# Patient Record
Sex: Male | Born: 1970 | Race: White | Hispanic: No | Smoking: Current every day smoker
Health system: Southern US, Community
[De-identification: ages and names within clinical notes are randomized; demographics above are authoritative.]

## PROBLEM LIST (undated history)

## (undated) DIAGNOSIS — C679 Malignant neoplasm of bladder, unspecified: Secondary | ICD-10-CM

## (undated) DIAGNOSIS — I10 Essential (primary) hypertension: Secondary | ICD-10-CM

## (undated) DIAGNOSIS — G8929 Other chronic pain: Secondary | ICD-10-CM

## (undated) DIAGNOSIS — M549 Dorsalgia, unspecified: Secondary | ICD-10-CM

## (undated) HISTORY — PX: BLADDER SURGERY: SHX569

## (undated) HISTORY — PX: OTHER SURGICAL HISTORY: SHX169

---

## 2003-07-07 ENCOUNTER — Encounter: Payer: Self-pay | Admitting: Internal Medicine

## 2003-07-07 ENCOUNTER — Encounter: Admission: RE | Admit: 2003-07-07 | Discharge: 2003-07-07 | Payer: Self-pay | Admitting: Internal Medicine

## 2003-11-02 ENCOUNTER — Ambulatory Visit (HOSPITAL_BASED_OUTPATIENT_CLINIC_OR_DEPARTMENT_OTHER): Admission: RE | Admit: 2003-11-02 | Discharge: 2003-11-02 | Payer: Self-pay | Admitting: Orthopedic Surgery

## 2004-11-14 ENCOUNTER — Encounter: Admission: RE | Admit: 2004-11-14 | Discharge: 2004-11-14 | Payer: Self-pay | Admitting: Family Medicine

## 2008-03-18 ENCOUNTER — Encounter: Payer: Self-pay | Admitting: Urology

## 2008-03-18 ENCOUNTER — Ambulatory Visit (HOSPITAL_BASED_OUTPATIENT_CLINIC_OR_DEPARTMENT_OTHER): Admission: RE | Admit: 2008-03-18 | Discharge: 2008-03-18 | Payer: Self-pay | Admitting: Urology

## 2008-06-18 ENCOUNTER — Emergency Department (HOSPITAL_COMMUNITY): Admission: EM | Admit: 2008-06-18 | Discharge: 2008-06-18 | Payer: Self-pay | Admitting: Emergency Medicine

## 2008-09-30 ENCOUNTER — Emergency Department (HOSPITAL_COMMUNITY): Admission: EM | Admit: 2008-09-30 | Discharge: 2008-09-30 | Payer: Self-pay | Admitting: Emergency Medicine

## 2009-09-07 ENCOUNTER — Encounter
Admission: RE | Admit: 2009-09-07 | Discharge: 2009-10-12 | Payer: Self-pay | Admitting: Physical Medicine and Rehabilitation

## 2009-11-14 ENCOUNTER — Ambulatory Visit (HOSPITAL_COMMUNITY)
Admission: RE | Admit: 2009-11-14 | Discharge: 2009-11-14 | Payer: Self-pay | Admitting: Physical Medicine and Rehabilitation

## 2010-08-22 ENCOUNTER — Emergency Department (HOSPITAL_BASED_OUTPATIENT_CLINIC_OR_DEPARTMENT_OTHER): Admission: EM | Admit: 2010-08-22 | Discharge: 2010-08-22 | Payer: Self-pay | Admitting: Emergency Medicine

## 2010-08-22 ENCOUNTER — Inpatient Hospital Stay (HOSPITAL_COMMUNITY): Admission: AD | Admit: 2010-08-22 | Discharge: 2010-08-24 | Payer: Self-pay | Admitting: Psychiatry

## 2010-08-22 ENCOUNTER — Ambulatory Visit: Payer: Self-pay | Admitting: Psychiatry

## 2010-08-22 ENCOUNTER — Ambulatory Visit: Payer: Self-pay | Admitting: Diagnostic Radiology

## 2010-12-29 ENCOUNTER — Emergency Department (HOSPITAL_COMMUNITY)
Admission: EM | Admit: 2010-12-29 | Discharge: 2010-12-29 | Payer: Self-pay | Source: Home / Self Care | Admitting: Emergency Medicine

## 2010-12-31 LAB — URINALYSIS, ROUTINE W REFLEX MICROSCOPIC
Bilirubin Urine: NEGATIVE
Hgb urine dipstick: NEGATIVE
Ketones, ur: NEGATIVE mg/dL
Leukocytes, UA: NEGATIVE
Nitrite: NEGATIVE
Protein, ur: NEGATIVE mg/dL
Specific Gravity, Urine: 1.013 (ref 1.005–1.030)
Urine Glucose, Fasting: >1000 mg/dL — AB
Urobilinogen, UA: 0.2 mg/dL (ref 0.0–1.0)
pH: 6.5 (ref 5.0–8.0)

## 2010-12-31 LAB — GLUCOSE, CAPILLARY
Glucose-Capillary: 331 mg/dL — ABNORMAL HIGH (ref 70–99)
Glucose-Capillary: 207 mg/dL — ABNORMAL HIGH (ref 70–99)

## 2010-12-31 LAB — BASIC METABOLIC PANEL
BUN: 1 mg/dL — ABNORMAL LOW (ref 6–23)
CO2: 26 mEq/L (ref 19–32)
Calcium: 9.3 mg/dL (ref 8.4–10.5)
Chloride: 103 mEq/L (ref 96–112)
Creatinine, Ser: 0.61 mg/dL (ref 0.4–1.5)
GFR calc Af Amer: >60 mL/min (ref >60)
GFR calc non Af Amer: >60 mL/min (ref >60)
Glucose, Bld: 268 mg/dL — ABNORMAL HIGH (ref 70–99)
Potassium: 3.2 mEq/L — ABNORMAL LOW (ref 3.5–5.1)
Sodium: 137 mEq/L (ref 135–145)

## 2010-12-31 LAB — URINE MICROSCOPIC-ADD ON: Urine-Other: NONE SEEN

## 2011-01-06 ENCOUNTER — Encounter: Payer: Self-pay | Admitting: Pain Medicine

## 2011-01-31 ENCOUNTER — Inpatient Hospital Stay (INDEPENDENT_AMBULATORY_CARE_PROVIDER_SITE_OTHER)
Admission: RE | Admit: 2011-01-31 | Discharge: 2011-01-31 | Disposition: A | Discharge status: Home / Self Care | Payer: Self-pay | Source: Ambulatory Visit | Attending: Family Medicine | Admitting: Family Medicine

## 2011-01-31 DIAGNOSIS — K029 Dental caries, unspecified: Secondary | ICD-10-CM

## 2011-02-28 LAB — GLUCOSE, CAPILLARY
Glucose-Capillary: 276 mg/dL — ABNORMAL HIGH (ref 70–99)
Glucose-Capillary: 278 mg/dL — ABNORMAL HIGH (ref 70–99)
Glucose-Capillary: 279 mg/dL — ABNORMAL HIGH (ref 70–99)
Glucose-Capillary: 300 mg/dL — ABNORMAL HIGH (ref 70–99)
Glucose-Capillary: 319 mg/dL — ABNORMAL HIGH (ref 70–99)
Glucose-Capillary: 322 mg/dL — ABNORMAL HIGH (ref 70–99)
Glucose-Capillary: 341 mg/dL — ABNORMAL HIGH (ref 70–99)
Glucose-Capillary: 344 mg/dL — ABNORMAL HIGH (ref 70–99)
Glucose-Capillary: 350 mg/dL — ABNORMAL HIGH (ref 70–99)

## 2011-02-28 LAB — URINALYSIS, ROUTINE W REFLEX MICROSCOPIC
Bilirubin Urine: NEGATIVE
Glucose, UA: >1000 mg/dL — AB
Hgb urine dipstick: NEGATIVE
Ketones, ur: NEGATIVE mg/dL
Leukocytes, UA: NEGATIVE
Nitrite: NEGATIVE
Protein, ur: NEGATIVE mg/dL
Specific Gravity, Urine: 1.044 — ABNORMAL HIGH (ref 1.005–1.030)
Urobilinogen, UA: 1 mg/dL (ref 0.0–1.0)
pH: 6.5 (ref 5.0–8.0)

## 2011-02-28 LAB — ELECTROLYTE PANEL
CO2: 28 mEq/L (ref 19–32)
Potassium: 3.8 mEq/L (ref 3.5–5.1)
Sodium: 140 mEq/L (ref 135–145)

## 2011-02-28 LAB — LIPID PANEL
Cholesterol: 231 mg/dL — ABNORMAL HIGH (ref 0–200)
HDL: 45 mg/dL (ref >39)
LDL Cholesterol: 123 mg/dL — ABNORMAL HIGH (ref 0–99)
Total CHOL/HDL Ratio: 5.1 RATIO
Triglycerides: 314 mg/dL — ABNORMAL HIGH (ref <150)
VLDL: 63 mg/dL — ABNORMAL HIGH (ref 0–40)

## 2011-02-28 LAB — CBC
HCT: 47.3 % (ref 39.0–52.0)
Hemoglobin: 15.5 g/dL (ref 13.0–17.0)
MCH: 28.6 pg (ref 26.0–34.0)
MCHC: 32.8 g/dL (ref 30.0–36.0)
MCV: 87.2 fL (ref 78.0–100.0)
Platelets: 316 10*3/uL (ref 150–400)
RBC: 5.43 MIL/uL (ref 4.22–5.81)
RDW: 13.4 % (ref 11.5–15.5)
WBC: 12.6 10*3/uL — ABNORMAL HIGH (ref 4.0–10.5)

## 2011-02-28 LAB — HEMOGLOBIN A1C
Hgb A1c MFr Bld: 12.7 % — ABNORMAL HIGH (ref <5.7)
Mean Plasma Glucose: 318 mg/dL — ABNORMAL HIGH (ref <117)

## 2011-02-28 LAB — POCT TOXICOLOGY PANEL

## 2011-02-28 LAB — DIFFERENTIAL
Basophils Absolute: 0 10*3/uL (ref 0.0–0.1)
Lymphocytes Relative: 25 % (ref 12–46)
Lymphs Abs: 3.1 10*3/uL (ref 0.7–4.0)
Neutro Abs: 8.8 10*3/uL — ABNORMAL HIGH (ref 1.7–7.7)
Neutrophils Relative %: 70 % (ref 43–77)

## 2011-02-28 LAB — ETHANOL: Alcohol, Ethyl (B): <10 mg/dL (ref 0–10)

## 2011-02-28 LAB — BUN: BUN: 10 mg/dL (ref 6–23)

## 2011-02-28 LAB — URINE MICROSCOPIC-ADD ON

## 2011-02-28 LAB — TSH: TSH: 1.11 u[IU]/mL (ref 0.350–4.500)

## 2011-02-28 LAB — CREATININE, SERUM: GFR calc non Af Amer: >60 mL/min (ref >60)

## 2011-04-30 NOTE — Op Note (Signed)
NAME:  Lawrence Pearson, Lawrence Pearson              ACCOUNT NO.:  [id number]   MEDICAL RECORD NO.:  [id number]          PATIENT TYPE:  AMB   LOCATION:  [facility]                         FACILITY:  [facility]   PHYSICIAN:  Bertram Millard. Dahlstedt, M.D.DATE OF BIRTH:  1971/08/03   DATE OF PROCEDURE:  03/18/2008  DATE OF DISCHARGE:                               OPERATIVE REPORT   PREOPERATIVE DIAGNOSIS:  Bladder tumor.   POSTOPERATIVE DIAGNOSIS:  Bladder tumor.   PRINCIPAL PROCEDURE:  TUR bladder tumor, 3 cm in size.   SURGEON:  Bertram Millard. Dahlstedt, M.D.   ANESTHESIA:  General with LMA.   COMPLICATIONS:  None.   BRIEF HISTORY:  A 40 year old male with a recently diagnosed left-sided  bladder tumor.  He presented with gross hematuria.  Upper tract  evaluation was negative.  CT showed a moderate sized left sidewall  tumor.  Cystoscopy confirmed this.  He presents at this time for  definitive TURBT.  Risks and complications have been discussed with the  patient and his wife.  They understand and desire to proceed.   DESCRIPTION OF PROCEDURE:  The patient was identified in the holding  area.  He was taken to the operating room where general anesthetic was  administered using the LMA.  He was placed in the dorsal lithotomy  position.  Genitalia and perineum were prepped and draped.  A time-out  was then called.  Proper patient identification, surgical site,  antibiotics, surgeon and position were discussed.  The procedure then  commenced.  His urethra was dilated to 80 [place] with [employer] sounds.  The obturator was then placed and the resectoscope placed through this.  There was an obvious tumor in the left sidewall, just lateral to the  ureteral orifice.  It was quite large.  The bladder was inspected  circumferentially otherwise.  There was no evident bladder lesion or  urothelial abnormality.  Using the loupe the tumor was resected.  First  the bulk of the tumor was resected and sent as bladder tumor.  I  then  sent the stump of the tumor which had been resected down into the muscle  and labeled bladder tumor base.  Hemostasis was then achieved with a  coag current.  There was no tumor specimen left in the bladder.  The  bladder was then drained with a 20 French Foley catheter which was  hooked to dependent drainage.   The patient tolerated the procedure well.  He was awakened, taken to the  PACU in stable condition.  He will be discharged on 3 days of Bactrim DS  one p.o. b.i.d. and Urell one p.o. q.6 h.  He will follow up in  approximately 2 weeks.     Bertram Millard. Dahlstedt, M.D.  Electronically Signed    SMD/MEDQ  D:  03/18/2008  T:  03/18/2008  Job:  [id number]

## 2011-05-03 NOTE — Op Note (Signed)
NAME:  Lawrence Pearson, Lawrence Pearson                        ACCOUNT NO.:  [id number]   MEDICAL RECORD NO.:  [id number]                   PATIENT TYPE:  AMB   LOCATION:  [facility]                                  FACILITY:  [facility]   PHYSICIAN:  Artist Pais. Mina Marble, M.D.           DATE OF BIRTH:  1971-04-04   DATE OF PROCEDURE:  11/02/2003  DATE OF DISCHARGE:                                 OPERATIVE REPORT   PREOPERATIVE DIAGNOSIS:  Traumatic laceration, right thumb, dorsal, with  nail plate fracture and loss of sterile matrix.   POSTOPERATIVE DIAGNOSIS:  Traumatic laceration, right thumb, dorsal, with  nail plate fracture and loss of sterile matrix.   PROCEDURE:  Partial distal phalangeal ostectomy, right thumb, with nail bed  repair.   SURGEON:  Artist Pais. Mina Marble, M.D.   ASSISTANT:  Aura Fey. Bobbe Medico.   ANESTHESIA:  General.   TOURNIQUET TIME:  Twenty minutes.   COMPLICATIONS:  No complications.   DRAINS:  No drains.   OPERATIVE REPORT:  The patient was taken to the operating room and after the  induction of adequate general anesthesia, the right upper extremity was  prepped and draped in the usual sterile fashion.  An Esmarch was used to  exsanguinate the limb.  Tourniquet was then inflated to 250 mmHg.  At this  point in time, inspection of the right thumb revealed a near-transverse  laceration dorsally with loss of the nail plate and a significant portion of  the middle of the sterile matrix down to the distal phalanx.  The wound was  debrided down to healthy tissue.  The distal aspect of the sterile matrix  was carefully dissected from the underlying distal phalanx and the distal  phalanx was removed in the distal 20% using a rongeur.  Once this was done,  the proximal aspect of the nail bed was also carefully elevated off the  underlying bone and the nail bed was then repaired with 5-0 undyed Vicryl x4  sutures.  After this was done, there was no exposed distal phalangeal bone.  The  wound was irrigated.  It was then dressed with Xeroform, 4 x 4's and  Coban wrap.  Patient tolerated the procedure well and went to recovery room  in stable fashion.                                               Artist Pais Mina Marble, M.D.    MAW/MEDQ  D:  11/02/2003  T:  11/02/2003  Job:  [id number]

## 2011-09-10 LAB — POCT HEMOGLOBIN-HEMACUE: Operator id: 114531

## 2011-09-17 LAB — DIFFERENTIAL
Lymphs Abs: 2.2
Monocytes Relative: 8
Neutro Abs: 3.9
Neutrophils Relative %: 57

## 2011-09-17 LAB — URINE MICROSCOPIC-ADD ON

## 2011-09-17 LAB — CBC
Hemoglobin: 13.7
MCHC: 32.4
RDW: 13.7

## 2011-09-17 LAB — URINALYSIS, ROUTINE W REFLEX MICROSCOPIC
Glucose, UA: >1000 — AB
Leukocytes, UA: NEGATIVE
pH: 5.5

## 2011-09-17 LAB — COMPREHENSIVE METABOLIC PANEL
BUN: 11
Calcium: 9.5
Glucose, Bld: 297 — ABNORMAL HIGH
Sodium: 134 — ABNORMAL LOW
Total Protein: 6.4

## 2012-01-01 ENCOUNTER — Inpatient Hospital Stay (HOSPITAL_COMMUNITY)
Admission: EM | Admit: 2012-01-01 | Discharge: 2012-01-03 | DRG: 287 | Disposition: A | Discharge status: Home / Self Care | Payer: Self-pay | Source: Ambulatory Visit | Attending: Internal Medicine | Admitting: Internal Medicine

## 2012-01-01 ENCOUNTER — Other Ambulatory Visit: Payer: Self-pay

## 2012-01-01 ENCOUNTER — Encounter (HOSPITAL_COMMUNITY): Payer: Self-pay | Admitting: Emergency Medicine

## 2012-01-01 ENCOUNTER — Emergency Department (HOSPITAL_COMMUNITY): Payer: Self-pay

## 2012-01-01 DIAGNOSIS — Z8551 Personal history of malignant neoplasm of bladder: Secondary | ICD-10-CM

## 2012-01-01 DIAGNOSIS — E669 Obesity, unspecified: Secondary | ICD-10-CM | POA: Diagnosis present

## 2012-01-01 DIAGNOSIS — Z9119 Patient's noncompliance with other medical treatment and regimen: Secondary | ICD-10-CM

## 2012-01-01 DIAGNOSIS — Z79899 Other long term (current) drug therapy: Secondary | ICD-10-CM

## 2012-01-01 DIAGNOSIS — Z23 Encounter for immunization: Secondary | ICD-10-CM

## 2012-01-01 DIAGNOSIS — I2510 Atherosclerotic heart disease of native coronary artery without angina pectoris: Principal | ICD-10-CM | POA: Diagnosis present

## 2012-01-01 DIAGNOSIS — IMO0002 Reserved for concepts with insufficient information to code with codable children: Secondary | ICD-10-CM | POA: Diagnosis present

## 2012-01-01 DIAGNOSIS — IMO0001 Reserved for inherently not codable concepts without codable children: Secondary | ICD-10-CM | POA: Diagnosis present

## 2012-01-01 DIAGNOSIS — Z794 Long term (current) use of insulin: Secondary | ICD-10-CM

## 2012-01-01 DIAGNOSIS — Z91199 Patient's noncompliance with other medical treatment and regimen due to unspecified reason: Secondary | ICD-10-CM

## 2012-01-01 DIAGNOSIS — E1165 Type 2 diabetes mellitus with hyperglycemia: Secondary | ICD-10-CM | POA: Diagnosis present

## 2012-01-01 DIAGNOSIS — I498 Other specified cardiac arrhythmias: Secondary | ICD-10-CM | POA: Diagnosis present

## 2012-01-01 DIAGNOSIS — E785 Hyperlipidemia, unspecified: Secondary | ICD-10-CM | POA: Diagnosis present

## 2012-01-01 DIAGNOSIS — F17200 Nicotine dependence, unspecified, uncomplicated: Secondary | ICD-10-CM | POA: Diagnosis present

## 2012-01-01 DIAGNOSIS — R079 Chest pain, unspecified: Secondary | ICD-10-CM | POA: Diagnosis present

## 2012-01-01 DIAGNOSIS — R209 Unspecified disturbances of skin sensation: Secondary | ICD-10-CM | POA: Diagnosis present

## 2012-01-01 HISTORY — DX: Malignant neoplasm of bladder, unspecified: C67.9

## 2012-01-01 LAB — DIFFERENTIAL
Basophils Absolute: 0.1 10*3/uL (ref 0.0–0.1)
Basophils Relative: 1 % (ref 0–1)
Eosinophils Relative: 1 % (ref 0–5)
Lymphocytes Relative: 33 % (ref 12–46)
Neutro Abs: 5.7 10*3/uL (ref 1.7–7.7)

## 2012-01-01 LAB — COMPREHENSIVE METABOLIC PANEL
ALT: 26 U/L (ref 0–53)
AST: 23 U/L (ref 0–37)
Albumin: 3.2 g/dL — ABNORMAL LOW (ref 3.5–5.2)
CO2: 24 mEq/L (ref 19–32)
Calcium: 10.2 mg/dL (ref 8.4–10.5)
GFR calc non Af Amer: >90 mL/min (ref >90)
Sodium: 135 mEq/L (ref 135–145)

## 2012-01-01 LAB — CBC
MCV: 90.3 fL (ref 78.0–100.0)
Platelets: 278 10*3/uL (ref 150–400)
RDW: 13.5 % (ref 11.5–15.5)
WBC: 9.8 10*3/uL (ref 4.0–10.5)

## 2012-01-01 LAB — APTT: aPTT: 25 seconds (ref 24–37)

## 2012-01-01 MED ORDER — NITROGLYCERIN 2 % TD OINT
1.0000 [in_us] | TOPICAL_OINTMENT | Freq: Four times a day (QID) | TRANSDERMAL | Status: DC
  Administered 2012-01-01 – 2012-01-02 (×3): 1 [in_us] via TOPICAL
  Filled 2012-01-01: qty 1
  Filled 2012-01-01 (×3): qty 30

## 2012-01-01 MED ORDER — SODIUM CHLORIDE 0.9 % IV BOLUS (SEPSIS)
1000.0000 mL | Freq: Once | INTRAVENOUS | Status: AC
  Administered 2012-01-01: 1000 mL via INTRAVENOUS

## 2012-01-01 MED ORDER — HYDROCODONE-ACETAMINOPHEN 10-325 MG PO TABS
1.0000 | ORAL_TABLET | Freq: Once | ORAL | Status: AC
  Administered 2012-01-01: 1 via ORAL
  Filled 2012-01-01: qty 1

## 2012-01-01 MED ORDER — INSULIN ASPART 100 UNIT/ML ~~LOC~~ SOLN
10.0000 [IU] | Freq: Once | SUBCUTANEOUS | Status: AC
  Administered 2012-01-01: 10 [IU] via SUBCUTANEOUS
  Filled 2012-01-01: qty 1

## 2012-01-01 MED ORDER — SODIUM CHLORIDE 0.9 % IV SOLN
Freq: Once | INTRAVENOUS | Status: AC
  Administered 2012-01-01: 22:00:00 via INTRAVENOUS

## 2012-01-01 NOTE — ED Notes (Signed)
Patient back from X-Ray.

## 2012-01-01 NOTE — ED Provider Notes (Signed)
History     CSN: [id number]  Arrival date & time 01/01/12  2134   First MD Initiated Contact with Patient 01/01/12 2148      Chief Complaint  Patient presents with  . Chest Pain    (Consider location/radiation/quality/duration/timing/severity/associated sxs/prior treatment) Patient is a 41 y.o. male presenting with chest pain. The history is provided by the patient.  Chest Pain    patient presents with chest pain which began after he got out of the shower. Pain described as substernal heaviness radiating to his neck and left shoulder. He had associated shortness of breath, diaphoresis, nausea. No prior history of this. He called EMS and was given nitroglycerin which improved his symptoms. He is currently almost chest pain free. Patient does have a history of diabetes but does not have any primary care followup  Past Medical History  Diagnosis Date  . Diabetes mellitus     No past surgical history on file.  No family history on file.  History  Substance Use Topics  . Smoking status: Not on file  . Smokeless tobacco: Not on file  . Alcohol Use:       Review of Systems  Cardiovascular: Positive for chest pain.  All other systems reviewed and are negative.    Allergies  Penicillins  Home Medications   Current Outpatient Rx  Name Route Sig Dispense Refill  . HYDROCODONE-ACETAMINOPHEN 10-325 MG PO TABS Oral Take 1 tablet by mouth every 6 (six) hours as needed. For pain    . METFORMIN HCL 500 MG PO TABS Oral Take 500 mg by mouth 2 (two) times daily with a meal.      BP 129/82  Pulse 107  Temp(Src) 99 F (37.2 C) (Oral)  Resp 20  SpO2 95%  Physical Exam  Nursing note and vitals reviewed. Constitutional: He is oriented to person, place, and time. He appears well-developed and well-nourished.  Non-toxic appearance. No distress.  HENT:  Head: Normocephalic and atraumatic.  Eyes: Conjunctivae, EOM and lids are normal. Pupils are equal, round, and reactive to  light.  Neck: Normal range of motion. Neck supple. No tracheal deviation present. No mass present.  Cardiovascular: Regular rhythm and normal heart sounds.  Tachycardia present.  Exam reveals no gallop.   No murmur heard. Pulmonary/Chest: Effort normal and breath sounds normal. No stridor. No respiratory distress. He has no decreased breath sounds. He has no wheezes. He has no rhonchi. He has no rales.  Abdominal: Soft. Normal appearance and bowel sounds are normal. He exhibits no distension. There is no tenderness. There is no rebound and no CVA tenderness.  Musculoskeletal: Normal range of motion. He exhibits no edema and no tenderness.  Neurological: He is alert and oriented to person, place, and time. He has normal strength. No cranial nerve deficit or sensory deficit. GCS eye subscore is 4. GCS verbal subscore is 5. GCS motor subscore is 6.  Skin: Skin is warm and dry. No abrasion and no rash noted.  Psychiatric: His speech is normal and behavior is normal. His affect is blunt.    ED Course  Procedures (including critical care time)   Labs Reviewed  CBC  DIFFERENTIAL  POCT I-STAT TROPONIN I  I-STAT TROPONIN I  COMPREHENSIVE METABOLIC PANEL  PROTIME-INR  APTT   Dg Chest 2 View  01/01/2012  *RADIOLOGY REPORT*  Clinical Data: Left chest pain  CHEST - 2 VIEW  Comparison: None.  Findings: Decreased lung volumes accentuating the vascular markings.  Normal heart size without  CHF, pneumonia, collapse, consolidation, effusion or pneumothorax.  Trachea midline.  IMPRESSION: Low volume exam.  No acute finding.  Original Report Authenticated By: Judie Petit. Ruel Favors, M.D.     No diagnosis found.    MDM  Pt given iv fluids and insulin for high blood sugar--nitropaste given for chest pain--pt to be admitted by triad         Toy Baker, MD 01/01/12 2328

## 2012-01-01 NOTE — ED Notes (Addendum)
Pt arrived via EMS with c/o pain in L side of chest and neck after taking a shower. Pt has nausea with SOB noted. No distress noted. EMS gave 324mg  ASA and NTG SL x2.

## 2012-01-02 ENCOUNTER — Encounter (HOSPITAL_COMMUNITY)
Admission: EM | Disposition: A | Discharge status: Home / Self Care | Payer: Self-pay | Source: Ambulatory Visit | Attending: Internal Medicine

## 2012-01-02 ENCOUNTER — Encounter (HOSPITAL_COMMUNITY): Payer: Self-pay | Admitting: Internal Medicine

## 2012-01-02 DIAGNOSIS — R079 Chest pain, unspecified: Secondary | ICD-10-CM | POA: Diagnosis present

## 2012-01-02 HISTORY — PX: LEFT HEART CATHETERIZATION WITH CORONARY ANGIOGRAM: SHX5451

## 2012-01-02 LAB — RAPID URINE DRUG SCREEN, HOSP PERFORMED
Amphetamines: NOT DETECTED
Benzodiazepines: NOT DETECTED
Cocaine: NOT DETECTED
Opiates: NOT DETECTED

## 2012-01-02 LAB — COMPREHENSIVE METABOLIC PANEL
ALT: 22 U/L (ref 0–53)
CO2: 25 mEq/L (ref 19–32)
Calcium: 9.3 mg/dL (ref 8.4–10.5)
Creatinine, Ser: 0.49 mg/dL — ABNORMAL LOW (ref 0.50–1.35)
GFR calc Af Amer: >90 mL/min (ref >90)
GFR calc non Af Amer: >90 mL/min (ref >90)
Glucose, Bld: 332 mg/dL — ABNORMAL HIGH (ref 70–99)
Sodium: 137 mEq/L (ref 135–145)

## 2012-01-02 LAB — CARDIAC PANEL(CRET KIN+CKTOT+MB+TROPI)
CK, MB: 1.6 ng/mL (ref 0.3–4.0)
Relative Index: INVALID (ref 0.0–2.5)
Relative Index: INVALID (ref 0.0–2.5)
Total CK: 30 U/L (ref 7–232)
Total CK: 30 U/L (ref 7–232)
Troponin I: <0.3 ng/mL (ref <0.30)

## 2012-01-02 LAB — GLUCOSE, CAPILLARY
Glucose-Capillary: 244 mg/dL — ABNORMAL HIGH (ref 70–99)
Glucose-Capillary: 414 mg/dL — ABNORMAL HIGH (ref 70–99)

## 2012-01-02 LAB — CBC
Hemoglobin: 12.6 g/dL — ABNORMAL LOW (ref 13.0–17.0)
MCHC: 31.8 g/dL (ref 30.0–36.0)
RDW: 13.5 % (ref 11.5–15.5)

## 2012-01-02 LAB — MAGNESIUM: Magnesium: 1.9 mg/dL (ref 1.5–2.5)

## 2012-01-02 LAB — HEMOGLOBIN A1C: Hgb A1c MFr Bld: 10.8 % — ABNORMAL HIGH (ref <5.7)

## 2012-01-02 LAB — D-DIMER, QUANTITATIVE: D-Dimer, Quant: 0.39 ug/mL-FEU (ref 0.00–0.48)

## 2012-01-02 LAB — LIPASE, BLOOD: Lipase: 46 U/L (ref 11–59)

## 2012-01-02 SURGERY — LEFT HEART CATHETERIZATION WITH CORONARY ANGIOGRAM
Anesthesia: LOCAL

## 2012-01-02 MED ORDER — HYDROCODONE-ACETAMINOPHEN 10-325 MG PO TABS
1.0000 | ORAL_TABLET | Freq: Four times a day (QID) | ORAL | Status: DC | PRN
  Administered 2012-01-02 – 2012-01-03 (×7): 1 via ORAL
  Filled 2012-01-02 (×7): qty 1

## 2012-01-02 MED ORDER — ACETAMINOPHEN 325 MG PO TABS: 650.0000 mg | ORAL_TABLET | ORAL | Status: DC | PRN

## 2012-01-02 MED ORDER — HYDROMORPHONE HCL PF 2 MG/ML IJ SOLN
INTRAMUSCULAR | Status: AC
  Filled 2012-01-02: qty 1

## 2012-01-02 MED ORDER — SODIUM CHLORIDE 0.9 % IV SOLN: 250.0000 mL | INTRAVENOUS | Status: DC | PRN

## 2012-01-02 MED ORDER — SODIUM CHLORIDE 0.9 % IJ SOLN: 3.0000 mL | Freq: Two times a day (BID) | INTRAMUSCULAR | Status: DC

## 2012-01-02 MED ORDER — ONDANSETRON HCL 4 MG/2ML IJ SOLN: 4.0000 mg | Freq: Four times a day (QID) | INTRAMUSCULAR | Status: DC | PRN

## 2012-01-02 MED ORDER — MIDAZOLAM HCL 2 MG/2ML IJ SOLN
INTRAMUSCULAR | Status: AC
  Filled 2012-01-02: qty 2

## 2012-01-02 MED ORDER — ACETAMINOPHEN 650 MG RE SUPP: 650.0000 mg | Freq: Four times a day (QID) | RECTAL | Status: DC | PRN

## 2012-01-02 MED ORDER — INSULIN ASPART 100 UNIT/ML ~~LOC~~ SOLN
0.0000 [IU] | Freq: Three times a day (TID) | SUBCUTANEOUS | Status: DC
  Administered 2012-01-02 (×2): 3 [IU] via SUBCUTANEOUS
  Administered 2012-01-03 (×2): 5 [IU] via SUBCUTANEOUS
  Filled 2012-01-02: qty 3

## 2012-01-02 MED ORDER — ROSUVASTATIN CALCIUM 40 MG PO TABS
40.0000 mg | ORAL_TABLET | Freq: Every day | ORAL | Status: DC
  Filled 2012-01-02: qty 1

## 2012-01-02 MED ORDER — SODIUM CHLORIDE 0.9 % IJ SOLN: 3.0000 mL | INTRAMUSCULAR | Status: DC | PRN

## 2012-01-02 MED ORDER — INSULIN GLARGINE 100 UNIT/ML ~~LOC~~ SOLN
5.0000 [IU] | Freq: Once | SUBCUTANEOUS | Status: AC
  Administered 2012-01-02: 5 [IU] via SUBCUTANEOUS
  Filled 2012-01-02: qty 1

## 2012-01-02 MED ORDER — NITROGLYCERIN 0.2 MG/ML ON CALL CATH LAB
INTRAVENOUS | Status: AC
  Filled 2012-01-02: qty 1

## 2012-01-02 MED ORDER — ASPIRIN EC 325 MG PO TBEC
325.0000 mg | DELAYED_RELEASE_TABLET | Freq: Every day | ORAL | Status: DC
  Administered 2012-01-02: 325 mg via ORAL
  Filled 2012-01-02: qty 1

## 2012-01-02 MED ORDER — HEPARIN (PORCINE) IN NACL 2-0.9 UNIT/ML-% IJ SOLN
INTRAMUSCULAR | Status: AC
  Filled 2012-01-02: qty 2000

## 2012-01-02 MED ORDER — SODIUM CHLORIDE 0.9 % IV SOLN
INTRAVENOUS | Status: DC
  Administered 2012-01-02 (×3): via INTRAVENOUS

## 2012-01-02 MED ORDER — SODIUM CHLORIDE 0.9 % IV SOLN
INTRAVENOUS | Status: AC
  Administered 2012-01-02: 19:00:00 via INTRAVENOUS

## 2012-01-02 MED ORDER — ONDANSETRON HCL 4 MG PO TABS: 4.0000 mg | ORAL_TABLET | Freq: Four times a day (QID) | ORAL | Status: DC | PRN

## 2012-01-02 MED ORDER — ROSUVASTATIN CALCIUM 40 MG PO TABS
40.0000 mg | ORAL_TABLET | ORAL | Status: AC
  Administered 2012-01-02: 40 mg via ORAL
  Filled 2012-01-02: qty 1

## 2012-01-02 MED ORDER — LIDOCAINE HCL (PF) 1 % IJ SOLN
INTRAMUSCULAR | Status: AC
  Filled 2012-01-02: qty 30

## 2012-01-02 MED ORDER — ASPIRIN EC 325 MG PO TBEC
325.0000 mg | DELAYED_RELEASE_TABLET | Freq: Every day | ORAL | Status: DC
  Administered 2012-01-03: 325 mg via ORAL
  Filled 2012-01-02: qty 1

## 2012-01-02 MED ORDER — ACETAMINOPHEN 325 MG PO TABS: 650.0000 mg | ORAL_TABLET | Freq: Four times a day (QID) | ORAL | Status: DC | PRN

## 2012-01-02 MED ORDER — INSULIN GLARGINE 100 UNIT/ML ~~LOC~~ SOLN
5.0000 [IU] | Freq: Every day | SUBCUTANEOUS | Status: DC
  Administered 2012-01-02: 5 [IU] via SUBCUTANEOUS
  Filled 2012-01-02: qty 3

## 2012-01-02 MED ORDER — ASPIRIN 81 MG PO CHEW: 324.0000 mg | CHEWABLE_TABLET | ORAL | Status: DC

## 2012-01-02 NOTE — Consult Note (Signed)
Cardiology Consult Note  Reason for Consult:Chest Pain Referring Physician: Dr. Rosita Kea is an 41 y.o. male.  HPI: The patient is a 41 year old male with past medical history notable for smoking diabetes that has been poorly controlled to begin have chest pain yesterday evening. He reports that he was exiting from his shower when he had sudden onset of left-sided chest pain and pressure with pain radiating to his neck and arm. He also reports some left arm numbness. This is accompanied by significant diaphoresis and nausea. It was not accompanied by any shortness of breath. He promptly called EMS. He reports that his pain is somewhat relieved by sublingual nitroglycerin but it has persisted. Currently, he is in 2-3/10 pain, located in the lateral left side of the chest. There is no radiation currently. His discomfort is made worse by deep breaths. It is also made worse by exertion.  Since arrival at the hospital, the patient has had multiple negative sets of troponins and has had no EKG changes.  Past Medical History  Diagnosis Date  . Diabetes mellitus   . Bladder cancer     Past Surgical History  Procedure Date  . Bladder surgery     bladder cancer surgery    Family History  Problem Relation Age of Onset  . Coronary artery disease Father     Social History:  reports that he has been smoking Cigarettes.  He has never used smokeless tobacco. He reports that he does not drink alcohol or use illicit drugs.  Allergies:  Allergies  Allergen Reactions  . Penicillins Hives    Medications: I have reviewed the patient's current medications.  Results for orders placed during the hospital encounter of 01/01/12 (from the past 48 hour(s))  CBC     Status: Normal   Collection Time   01/01/12  9:54 PM      Component Value Range Comment   WBC 9.8  4.0 - 10.5 (K/uL)    RBC 4.54  4.22 - 5.81 (MIL/uL)    Hemoglobin 13.1  13.0 - 17.0 (g/dL)    HCT 16.1  09.6 - 04.5 (%)      MCV 90.3  78.0 - 100.0 (fL)    MCH 28.9  26.0 - 34.0 (pg)    MCHC 32.0  30.0 - 36.0 (g/dL)    RDW 40.9  81.1 - 91.4 (%)    Platelets 278  150 - 400 (K/uL)   DIFFERENTIAL     Status: Normal   Collection Time   01/01/12  9:54 PM      Component Value Range Comment   Neutrophils Relative 59  43 - 77 (%)    Neutro Abs 5.7  1.7 - 7.7 (K/uL)    Lymphocytes Relative 33  12 - 46 (%)    Lymphs Abs 3.2  0.7 - 4.0 (K/uL)    Monocytes Relative 7  3 - 12 (%)    Monocytes Absolute 0.6  0.1 - 1.0 (K/uL)    Eosinophils Relative 1  0 - 5 (%)    Eosinophils Absolute 0.1  0.0 - 0.7 (K/uL)    Basophils Relative 1  0 - 1 (%)    Basophils Absolute 0.1  0.0 - 0.1 (K/uL)   COMPREHENSIVE METABOLIC PANEL     Status: Abnormal   Collection Time   01/01/12  9:54 PM      Component Value Range Comment   Sodium 135  135 - 145 (mEq/L)    Potassium 4.0  3.5 - 5.1 (mEq/L)    Chloride 99  96 - 112 (mEq/L)    CO2 24  19 - 32 (mEq/L)    Glucose, Bld 549 (*) 70 - 99 (mg/dL)    BUN 8  6 - 23 (mg/dL)    Creatinine, Ser 1.91  0.50 - 1.35 (mg/dL)    Calcium 47.8  8.4 - 10.5 (mg/dL)    Total Protein 6.6  6.0 - 8.3 (g/dL)    Albumin 3.2 (*) 3.5 - 5.2 (g/dL)    AST 23  0 - 37 (U/L)    ALT 26  0 - 53 (U/L)    Alkaline Phosphatase 124 (*) 39 - 117 (U/L)    Total Bilirubin 0.1 (*) 0.3 - 1.2 (mg/dL)    GFR calc non Af Amer >90  >90 (mL/min)    GFR calc Af Amer >90  >90 (mL/min)   PROTIME-INR     Status: Normal   Collection Time   01/01/12  9:54 PM      Component Value Range Comment   Prothrombin Time 12.1  11.6 - 15.2 (seconds)    INR 0.88  0.00 - 1.49    APTT     Status: Normal   Collection Time   01/01/12  9:54 PM      Component Value Range Comment   aPTT 25  24 - 37 (seconds)   POCT I-STAT TROPONIN I     Status: Normal   Collection Time   01/01/12 10:09 PM      Component Value Range Comment   Troponin i, poc 0.00  0.00 - 0.08 (ng/mL)    Comment 3            GLUCOSE, CAPILLARY     Status: Abnormal   Collection  Time   01/02/12 12:23 AM      Component Value Range Comment   Glucose-Capillary 392 (*) 70 - 99 (mg/dL)   URINE RAPID DRUG SCREEN (HOSP PERFORMED)     Status: Normal   Collection Time   01/02/12  1:03 AM      Component Value Range Comment   Opiates NONE DETECTED  NONE DETECTED     Cocaine NONE DETECTED  NONE DETECTED     Benzodiazepines NONE DETECTED  NONE DETECTED     Amphetamines NONE DETECTED  NONE DETECTED     Tetrahydrocannabinol NONE DETECTED  NONE DETECTED     Barbiturates NONE DETECTED  NONE DETECTED    CARDIAC PANEL(CRET KIN+CKTOT+MB+TROPI)     Status: Normal   Collection Time   01/02/12  1:39 AM      Component Value Range Comment   Total CK 30  7 - 232 (U/L)    CK, MB 1.6  0.3 - 4.0 (ng/mL)    Troponin I <0.30  <0.30 (ng/mL)    Relative Index RELATIVE INDEX IS INVALID  0.0 - 2.5    D-DIMER, QUANTITATIVE     Status: Normal   Collection Time   01/02/12  1:58 AM      Component Value Range Comment   D-Dimer, Quant 0.39  0.00 - 0.48 (ug/mL-FEU)   LIPASE, BLOOD     Status: Normal   Collection Time   01/02/12  1:58 AM      Component Value Range Comment   Lipase 46  11 - 59 (U/L)   GLUCOSE, CAPILLARY     Status: Abnormal   Collection Time   01/02/12  4:02 AM      Component Value  Range Comment   Glucose-Capillary 265 (*) 70 - 99 (mg/dL)    Comment 1 Notify RN     TSH     Status: Abnormal   Collection Time   01/02/12  5:00 AM      Component Value Range Comment   TSH 0.327 (*) 0.350 - 4.500 (uIU/mL)   COMPREHENSIVE METABOLIC PANEL     Status: Abnormal   Collection Time   01/02/12  5:00 AM      Component Value Range Comment   Sodium 137  135 - 145 (mEq/L)    Potassium 3.6  3.5 - 5.1 (mEq/L)    Chloride 101  96 - 112 (mEq/L)    CO2 25  19 - 32 (mEq/L)    Glucose, Bld 332 (*) 70 - 99 (mg/dL)    BUN 10  6 - 23 (mg/dL)    Creatinine, Ser 4.09 (*) 0.50 - 1.35 (mg/dL)    Calcium 9.3  8.4 - 10.5 (mg/dL)    Total Protein 6.3  6.0 - 8.3 (g/dL)    Albumin 3.0 (*) 3.5 - 5.2 (g/dL)     AST 22  0 - 37 (U/L)    ALT 22  0 - 53 (U/L)    Alkaline Phosphatase 97  39 - 117 (U/L)    Total Bilirubin 0.1 (*) 0.3 - 1.2 (mg/dL)    GFR calc non Af Amer >90  >90 (mL/min)    GFR calc Af Amer >90  >90 (mL/min)   CBC     Status: Abnormal   Collection Time   01/02/12  5:00 AM      Component Value Range Comment   WBC 9.4  4.0 - 10.5 (K/uL)    RBC 4.39  4.22 - 5.81 (MIL/uL)    Hemoglobin 12.6 (*) 13.0 - 17.0 (g/dL)    HCT 81.1  91.4 - 78.2 (%)    MCV 90.2  78.0 - 100.0 (fL)    MCH 28.7  26.0 - 34.0 (pg)    MCHC 31.8  30.0 - 36.0 (g/dL)    RDW 95.6  21.3 - 08.6 (%)    Platelets 260  150 - 400 (K/uL)   MAGNESIUM     Status: Normal   Collection Time   01/02/12  5:00 AM      Component Value Range Comment   Magnesium 1.9  1.5 - 2.5 (mg/dL)   GLUCOSE, CAPILLARY     Status: Abnormal   Collection Time   01/02/12  8:04 AM      Component Value Range Comment   Glucose-Capillary 231 (*) 70 - 99 (mg/dL)    Comment 1 Notify RN      Comment 2 Documented in Chart     CARDIAC PANEL(CRET KIN+CKTOT+MB+TROPI)     Status: Normal   Collection Time   01/02/12  8:35 AM      Component Value Range Comment   Total CK 30  7 - 232 (U/L)    CK, MB 1.7  0.3 - 4.0 (ng/mL)    Troponin I <0.30  <0.30 (ng/mL)    Relative Index RELATIVE INDEX IS INVALID  0.0 - 2.5      Dg Chest 2 View  01/01/2012  *RADIOLOGY REPORT*  Clinical Data: Left chest pain  CHEST - 2 VIEW  Comparison: None.  Findings: Decreased lung volumes accentuating the vascular markings.  Normal heart size without CHF, pneumonia, collapse, consolidation, effusion or pneumothorax.  Trachea midline.  IMPRESSION: Low volume exam.  No acute finding.  Original Report Authenticated By: Judie Petit. Ruel Favors, M.D.    ROS patient reports chest per history of present illness currently denies and shortness of breath, nausea, lightheadedness, visual changes, abdominal pain, rashes, joint deformities, or lower extremity edema. Blood pressure 119/75, pulse 72,  temperature 97.6 F (36.4 C), temperature source Oral, resp. rate 16, height 5\' 6"  (1.676 m), weight 251 lb 12.3 oz (114.2 kg), SpO2 97.00%. Physical Exam Gen: Obese male, no acute distress HEENT: No JVD, trachea midline, EMOI CV: RRR, no murmurs or extra heart sounds Resp: CTABL, no wheezes Abd: SNTND, obese Ext: No edema Neuro: Alert and oriented x3, no gross cranial nerve deficits  Assessment/Plan: 1) Chest pain concerning for ACS:  No EKG changes, no elevation of troponins, however history is consistent with myocardial ischemia and the patient has multiple risk factors (uncontrolled DM, smoking, obesity).  We will plan to proceed with cardiac catheterization this afternoon.  Pt advised of risks/benefits and agrees with plan. 2) Uncontrolled DM2:  Management per primary team.  Would suggest avoiding a discharge plan involving Lantus secondary to the patient's financial hardship. 3) Presumed HLD: had HLD in 2011, will start on Crestor and await results of FLP. 4) Tobacco abuse: Cessation counciling. 5) H/o bladder cancer: has not had any follow-up for this issue.  Would recommend this be set up before discharge.  Pt currently symptom free. 6) Obesity: Counciled on need for weight loss  Pt seen and discussed with Dr. Jacinto Halim who agrees with the above plan.  RITCH,ERIK 01/02/2012, 11:28 AM

## 2012-01-02 NOTE — H&P (Signed)
Lawrence Pearson is an 41 y.o. male.   PCP - None. Chief Complaint: Chest pain. HPI: 41 year-old male with history of diabetes mellitus type 2 diagnosed 6 months ago has not been taking any medications for last 2 months due to financial reasons, history of bladder cancer presented to the ER with complaints of chest pain since last night 8 PM. The pain is retrosternal pressure-like nonradiating associated with some nausea. Patient in addition has some epigastric discomfort. Denies any shortness of breath diaphoresis dizziness. His first set of cardiac enzymes being negative an EKG shows sinus tachycardia.  Past Medical History  Diagnosis Date  . Diabetes mellitus   . Bladder cancer     History reviewed. No pertinent past surgical history.  Family History  Problem Relation Age of Onset  . Coronary artery disease Father    Social History:  reports that he has been smoking.  He does not have any smokeless tobacco history on file. He reports that he does not drink alcohol or use illicit drugs.  Allergies:  Allergies  Allergen Reactions  . Penicillins Hives    Medications Prior to Admission  Medication Dose Route Frequency Provider Last Rate Last Dose  . 0.9 %  sodium chloride infusion   Intravenous Once Toy Baker, MD 125 mL/hr at 01/01/12 2224    . HYDROcodone-acetaminophen (NORCO) 10-325 MG per tablet 1 tablet  1 tablet Oral Once Toy Baker, MD   1 tablet at 01/01/12 2232  . insulin aspart (novoLOG) injection 10 Units  10 Units Subcutaneous Once Toy Baker, MD   10 Units at 01/01/12 2300  . insulin glargine (LANTUS) injection 5 Units  5 Units Subcutaneous Once Eduard Clos, MD   5 Units at 01/02/12 0028  . nitroGLYCERIN (NITROGLYN) 2 % ointment 1 inch  1 inch Topical Q6H Toy Baker, MD   1 inch at 01/01/12 2228  . sodium chloride 0.9 % bolus 1,000 mL  1,000 mL Intravenous Once Toy Baker, MD   1,000 mL at 01/01/12 2305   No current outpatient  prescriptions on file as of 01/01/2012.    Results for orders placed during the hospital encounter of 01/01/12 (from the past 48 hour(s))  CBC     Status: Normal   Collection Time   01/01/12  9:54 PM      Component Value Range Comment   WBC 9.8  4.0 - 10.5 (K/uL)    RBC 4.54  4.22 - 5.81 (MIL/uL)    Hemoglobin 13.1  13.0 - 17.0 (g/dL)    HCT 16.1  09.6 - 04.5 (%)    MCV 90.3  78.0 - 100.0 (fL)    MCH 28.9  26.0 - 34.0 (pg)    MCHC 32.0  30.0 - 36.0 (g/dL)    RDW 40.9  81.1 - 91.4 (%)    Platelets 278  150 - 400 (K/uL)   DIFFERENTIAL     Status: Normal   Collection Time   01/01/12  9:54 PM      Component Value Range Comment   Neutrophils Relative 59  43 - 77 (%)    Neutro Abs 5.7  1.7 - 7.7 (K/uL)    Lymphocytes Relative 33  12 - 46 (%)    Lymphs Abs 3.2  0.7 - 4.0 (K/uL)    Monocytes Relative 7  3 - 12 (%)    Monocytes Absolute 0.6  0.1 - 1.0 (K/uL)    Eosinophils Relative 1  0 -  5 (%)    Eosinophils Absolute 0.1  0.0 - 0.7 (K/uL)    Basophils Relative 1  0 - 1 (%)    Basophils Absolute 0.1  0.0 - 0.1 (K/uL)   COMPREHENSIVE METABOLIC PANEL     Status: Abnormal   Collection Time   01/01/12  9:54 PM      Component Value Range Comment   Sodium 135  135 - 145 (mEq/L)    Potassium 4.0  3.5 - 5.1 (mEq/L)    Chloride 99  96 - 112 (mEq/L)    CO2 24  19 - 32 (mEq/L)    Glucose, Bld 549 (*) 70 - 99 (mg/dL)    BUN 8  6 - 23 (mg/dL)    Creatinine, Ser 7.82  0.50 - 1.35 (mg/dL)    Calcium 95.6  8.4 - 10.5 (mg/dL)    Total Protein 6.6  6.0 - 8.3 (g/dL)    Albumin 3.2 (*) 3.5 - 5.2 (g/dL)    AST 23  0 - 37 (U/L)    ALT 26  0 - 53 (U/L)    Alkaline Phosphatase 124 (*) 39 - 117 (U/L)    Total Bilirubin 0.1 (*) 0.3 - 1.2 (mg/dL)    GFR calc non Af Amer >90  >90 (mL/min)    GFR calc Af Amer >90  >90 (mL/min)   PROTIME-INR     Status: Normal   Collection Time   01/01/12  9:54 PM      Component Value Range Comment   Prothrombin Time 12.1  11.6 - 15.2 (seconds)    INR 0.88  0.00 - 1.49     APTT     Status: Normal   Collection Time   01/01/12  9:54 PM      Component Value Range Comment   aPTT 25  24 - 37 (seconds)   POCT I-STAT TROPONIN I     Status: Normal   Collection Time   01/01/12 10:09 PM      Component Value Range Comment   Troponin i, poc 0.00  0.00 - 0.08 (ng/mL)    Comment 3            GLUCOSE, CAPILLARY     Status: Abnormal   Collection Time   01/02/12 12:23 AM      Component Value Range Comment   Glucose-Capillary 392 (*) 70 - 99 (mg/dL)    Dg Chest 2 View  01/29/864  *RADIOLOGY REPORT*  Clinical Data: Left chest pain  CHEST - 2 VIEW  Comparison: None.  Findings: Decreased lung volumes accentuating the vascular markings.  Normal heart size without CHF, pneumonia, collapse, consolidation, effusion or pneumothorax.  Trachea midline.  IMPRESSION: Low volume exam.  No acute finding.  Original Report Authenticated By: Judie Petit. Ruel Favors, M.D.    Review of Systems  Constitutional: Negative.   HENT: Negative.   Eyes: Negative.   Respiratory: Negative.   Cardiovascular: Positive for chest pain.  Gastrointestinal: Positive for nausea.  Genitourinary: Negative.   Musculoskeletal: Negative.   Skin: Negative.   Neurological: Negative.   Endo/Heme/Allergies: Negative.   Psychiatric/Behavioral: Negative.     Blood pressure 106/65, pulse 96, temperature 98.8 F (37.1 C), temperature source Oral, resp. rate 16, SpO2 97.00%. Physical Exam  Constitutional: He is oriented to person, place, and time. He appears well-developed and well-nourished. No distress.  HENT:  Head: Normocephalic and atraumatic.  Right Ear: External ear normal.  Left Ear: External ear normal.  Eyes: Conjunctivae and EOM are  normal. Pupils are equal, round, and reactive to light. Right eye exhibits no discharge. Left eye exhibits no discharge. No scleral icterus.  Neck: Normal range of motion. Neck supple.  Cardiovascular: Regular rhythm.        Mildly tachycardic.  Respiratory: Effort normal  and breath sounds normal. No respiratory distress. He has no wheezes. He has no rales.  GI: Bowel sounds are normal. He exhibits no distension. There is no tenderness. There is no rebound.  Musculoskeletal: Normal range of motion. He exhibits no edema and no tenderness.  Neurological: He is alert and oriented to person, place, and time. He has normal reflexes.       Moves upper and lower extremities.  Skin: Skin is warm and dry. He is not diaphoretic.  Psychiatric: His behavior is normal.     Assessment/Plan #1. Chest pain with typical features concerning for ACS - we will cycle cardiac markers. Place patient on aspirin and nitroglycerin paste. We will keep patient n.p.o. past midnight in anticipation of possible procedure by cardiologist. We will check urine drug screen. We will also check lipase and d-dimer. As patient is tachycardic we will check TSH. #2. Uncontrolled diabetes mellitus type 2 not in DKA -  Patient has received 10 units of NovoLog insulin and I have ordered 5 units of Lantus. We'll closely follow CBG. Check hemoglobin A1c. Patient eventually may be discharged on metformin but while in hospital patient will be on Lantus and sliding scale coverage. #3. Ongoing tobacco abuse - will need tobacco cessation counseling. #4. History of bladder cancer.  CODE STATUS - full code.  Niyonna Betsill N. 01/02/2012, 12:43 AM

## 2012-01-02 NOTE — Brief Op Note (Signed)
01/01/2012 - 01/02/2012  3:01 PM  PATIENT:  Lawrence Pearson  41 y.o. male  PRE-OPERATIVE DIAGNOSIS:  chest pain  POST-OPERATIVE DIAGNOSIS:  Mild diffuse small vessel disease. Mild luminal irregularities.  PROCEDURE:  Procedure(s): LEFT HEART CATHETERIZATION WITH CORONARY ANGIOGRAM  SURGEON:  Surgeon(s): Pamella Pert, MD  DICTATION: .Other Dictation: Dictation Number [id number]  D/W Dr. Roanna Epley. Will signoff needs primary prevention.

## 2012-01-02 NOTE — Progress Notes (Signed)
Inpatient Diabetes Program Recommendations  AACE/ADA: New Consensus Statement on Inpatient Glycemic Control (2009)  Target Ranges:  Prepandial:   less than 140 mg/dL      Peak postprandial:   less than 180 mg/dL (1-2 hours)      Critically ill patients:  140 - 180 mg/dL   Reason for Visit: Results for Lawrence Pearson, Lawrence Pearson (MRN [id number]) as of 01/02/2012 12:33  Ref. Range 12/29/2010 16:50 01/02/2012 00:23 01/02/2012 04:02 01/02/2012 08:04 01/02/2012 11:53  Glucose-Capillary Latest Range: 70-99 mg/dL 409 (H) 811 (H) 914 (H) 231 (H) 244 (H)    Inpatient Diabetes Program Recommendations Insulin - Basal: Please increase Lantus to 20 units daily (.2 units per kg). Correction (SSI): While NPO increase correction frequency to moderate q 4 hours. HgbA1C: in process.  Note: Note A1C pending.  May need insulin at discharge.  Will follow.

## 2012-01-02 NOTE — Progress Notes (Signed)
Subjective: patient seen and examined this am. Still c/o left sided CP 2/10 radiating to left arm.   Objective:  Vital signs in last 24 hours:  Filed Vitals:   01/02/12 0030 01/02/12 0045 01/02/12 0134 01/02/12 0500  BP: 107/68 113/65 117/79 119/75  Pulse: 84 88 78 72  Temp:   97.8 F (36.6 C) 97.6 F (36.4 C)  TempSrc:   Oral Oral  Resp: 14 13 20 16   Height:   5\' 6"  (1.676 m)   Weight:   114.2 kg (251 lb 12.3 oz)   SpO2: 96% 96% 96% 97%    Intake/Output from previous day:  No intake or output data in the 24 hours ending 01/02/12 1435  Physical Exam:  General: middle aged male  in no acute distress. HEENT: no pallor, no icterus, moist oral mucosa, no JVD, no lymphadenopathy Heart:  s1 &s2  Tachycardic , regular  rhythm, without murmurs, rubs, gallops. Lungs: Clear to auscultation bilaterally. Abdomen: Soft, nontender, nondistended, positive bowel sounds. Extremities: No clubbing cyanosis or edema with positive pedal pulses. Neuro: Alert, awake, oriented x3, nonfocal.   Lab Results:  Basic Metabolic Panel:    Component Value Date/Time   NA 137 01/02/2012 0500   K 3.6 01/02/2012 0500   CL 101 01/02/2012 0500   CO2 25 01/02/2012 0500   BUN 10 01/02/2012 0500   CREATININE 0.49* 01/02/2012 0500   GLUCOSE 332* 01/02/2012 0500   CALCIUM 9.3 01/02/2012 0500   CBC:    Component Value Date/Time   WBC 9.4 01/02/2012 0500   HGB 12.6* 01/02/2012 0500   HCT 39.6 01/02/2012 0500   PLT 260 01/02/2012 0500   MCV 90.2 01/02/2012 0500   NEUTROABS 5.7 01/01/2012 2154   LYMPHSABS 3.2 01/01/2012 2154   MONOABS 0.6 01/01/2012 2154   EOSABS 0.1 01/01/2012 2154   BASOSABS 0.1 01/01/2012 2154    No results found for this or any previous visit (from the past 240 hour(s)).  Studies/Results: Dg Chest 2 View  01/01/2012  *RADIOLOGY REPORT*  Clinical Data: Left chest pain  CHEST - 2 VIEW  Comparison: None.  Findings: Decreased lung volumes accentuating the vascular markings.  Normal heart size  without CHF, pneumonia, collapse, consolidation, effusion or pneumothorax.  Trachea midline.  IMPRESSION: Low volume exam.  No acute finding.  Original Report Authenticated By: Judie Petit. Ruel Favors, M.D.    Medications: Scheduled Meds:   . sodium chloride   Intravenous Once  . aspirin  324 mg Oral Pre-Cath  . aspirin EC  325 mg Oral Daily  . heparin      . HYDROcodone-acetaminophen  1 tablet Oral Once  . insulin aspart  0-9 Units Subcutaneous TID WC  . insulin aspart  10 Units Subcutaneous Once  . insulin glargine  5 Units Subcutaneous Once  . insulin glargine  5 Units Subcutaneous QHS  . lidocaine      . nitroGLYCERIN  1 inch Topical Q6H  . nitroGLYCERIN      . rosuvastatin  40 mg Oral NOW   Followed by  . rosuvastatin  40 mg Oral Daily  . sodium chloride  1,000 mL Intravenous Once  . sodium chloride  3 mL Intravenous Q12H  . DISCONTD: aspirin EC  325 mg Oral Daily   Continuous Infusions:   . sodium chloride 100 mL/hr at 01/02/12 0909   PRN Meds:.sodium chloride, acetaminophen, acetaminophen, HYDROcodone-acetaminophen, ondansetron (ZOFRAN) IV, ondansetron, sodium chloride  Assessment  41 y/o male with hx of DM, smoker and bladder  ca presented with left sided chest pain radiating to left arm of 1 day duration.  Plan: Chest pain  appears to be quite typical for underlying myocardial ischemia  follow serial CE stabel on tele except for tachycardia  cont  ASA , sl nitro Cardiology consult called  planned on cardiac cath his pm Follow lipid panel ; A1C of 10.8   DIABETES MELLITUS  Patient on metformin at home but not taking meds due to insurance issues  cont lantus and SSI for now Given high A1C should be discharged on lantus  Tobacco use  counseled on smoking cessation   Full code   LOS: 1 day   Sophi Calligan 01/02/2012, 2:35 PM

## 2012-01-02 NOTE — Progress Notes (Signed)
Utilization review completed. Lawrence Pearson 01/02/2012 

## 2012-01-03 DIAGNOSIS — E1165 Type 2 diabetes mellitus with hyperglycemia: Secondary | ICD-10-CM | POA: Diagnosis present

## 2012-01-03 DIAGNOSIS — E785 Hyperlipidemia, unspecified: Secondary | ICD-10-CM | POA: Diagnosis present

## 2012-01-03 DIAGNOSIS — I2510 Atherosclerotic heart disease of native coronary artery without angina pectoris: Secondary | ICD-10-CM | POA: Diagnosis present

## 2012-01-03 LAB — LIPID PANEL
HDL: 42 mg/dL (ref >39)
Total CHOL/HDL Ratio: 5 RATIO

## 2012-01-03 LAB — GLUCOSE, CAPILLARY: Glucose-Capillary: 279 mg/dL — ABNORMAL HIGH (ref 70–99)

## 2012-01-03 MED ORDER — INSULIN NPH (HUMAN) (ISOPHANE) 100 UNIT/ML ~~LOC~~ SUSP: 15.0000 [IU] | Freq: Two times a day (BID) | SUBCUTANEOUS | Status: DC

## 2012-01-03 MED ORDER — METFORMIN HCL 500 MG PO TABS: 500.0000 mg | ORAL_TABLET | Freq: Two times a day (BID) | ORAL | Status: DC

## 2012-01-03 MED ORDER — NITROGLYCERIN 0.4 MG SL SUBL: 0.4000 mg | SUBLINGUAL_TABLET | SUBLINGUAL | Status: DC | PRN

## 2012-01-03 MED ORDER — ASPIRIN 325 MG PO TBEC: 325.0000 mg | DELAYED_RELEASE_TABLET | Freq: Every day | ORAL | Status: DC

## 2012-01-03 MED ORDER — FREESTYLE SYSTEM KIT: 1.0000 | PACK | Status: DC | PRN

## 2012-01-03 MED ORDER — LISINOPRIL 2.5 MG PO TABS: 5.0000 mg | ORAL_TABLET | Freq: Every day | ORAL | Status: DC

## 2012-01-03 MED ORDER — METOPROLOL TARTRATE 12.5 MG HALF TABLET: 12.5000 mg | ORAL_TABLET | Freq: Two times a day (BID) | ORAL | Status: DC

## 2012-01-03 MED ORDER — GLUCOSE BLOOD VI STRP: ORAL_STRIP | Status: DC

## 2012-01-03 MED ORDER — ASPIRIN 325 MG PO TBEC: 325.0000 mg | DELAYED_RELEASE_TABLET | Freq: Every day | ORAL | Status: AC

## 2012-01-03 MED ORDER — ROSUVASTATIN CALCIUM 40 MG PO TABS: 40.0000 mg | ORAL_TABLET | Freq: Every day | ORAL | Status: DC

## 2012-01-03 MED ORDER — ACCU-CHEK SOFT TOUCH LANCETS MISC: Status: DC

## 2012-01-03 MED ORDER — ALCOHOL PREP SWABS PADS: 1.0000 | MEDICATED_PAD | Freq: Every day | Status: DC

## 2012-01-03 MED ORDER — METOPROLOL TARTRATE 12.5 MG HALF TABLET
12.5000 mg | ORAL_TABLET | Freq: Two times a day (BID) | ORAL | Status: DC
  Administered 2012-01-03: 12.5 mg via ORAL
  Filled 2012-01-03 (×3): qty 1

## 2012-01-03 NOTE — Discharge Summary (Signed)
Patient ID: Lawrence Pearson MRN: [id number] DOB/AGE: 08-17-1971 41 y.o.  Admit date: 01/01/2012 Discharge date: 01/03/2012  Primary Care Physician:  Daisy Floro, MD, MD  Discharge Diagnoses:    Principal Problem:  *Coronary artery disease  Active Problems:  Uncontrolled diabetes mellitus  anginal Chest pain  Dyslipidemia Hx of bladder ca Tobacco use  Current Discharge Medication List    START taking these medications   Details  Alcohol Swabs (ALCOHOL PREP SWABS) PADS 1 each by Does not apply route daily. Qty: 60 each, Refills: 2    aspirin 325 MG EC tablet Take 1 tablet (325 mg total) by mouth daily. Qty: 30 tablet, Refills: 3    glucose blood (GLUCOMETER ELITE TEST STRIPS) test strip Use as instructed Qty: 100 each, Refills: 12    glucose monitoring kit (FREESTYLE) monitoring kit 1 each by Does not apply route as needed for other. Qty: 1 each, Refills: 0    insulin NPH (HUMULIN N) 100 UNIT/ML injection Inject 15 Units into the skin 2 (two) times daily. Qty: 10 mL, Refills: 12    Lancets (ACCU-CHEK SOFT TOUCH) lancets Use as instructed Qty: 100 each, Refills: 12    lisinopril (ZESTRIL) 2.5 MG tablet Take 2 tablets (5 mg total) by mouth daily. Qty: 30 tablet, Refills: 3    metoprolol tartrate (LOPRESSOR) 12.5 mg TABS Take 0.5 tablets (12.5 mg total) by mouth 2 (two) times daily. Qty: 60 tablet, Refills: 3    rosuvastatin (CRESTOR) 40 MG tablet Take 1 tablet (40 mg total) by mouth daily. Qty: 30 tablet, Refills: 3    S/l nitroglycerine 0.4 mg tablet               Take 1 tablet as needed every 5 mins for chest pain as indicated   CONTINUE these medications which have CHANGED   Details  metFORMIN (GLUCOPHAGE) 500 MG tablet Take 1 tablet (500 mg total) by mouth 2 (two) times daily with a meal. Qty: 1000 tablet, Refills: 3      STOP taking these medications     HYDROcodone-acetaminophen (NORCO) 10-325 MG per tablet         Disposition and Follow-up:    Follow up with health serv  Consults:   Ganji ( cardiology)  Significant Diagnostic Studies:  Dg Chest 2 View  01/01/2012  *RADIOLOGY REPORT*  Clinical Data: Left chest pain  CHEST - 2 VIEW  Comparison: None.  Findings: Decreased lung volumes accentuating the vascular markings.  Normal heart size without CHF, pneumonia, collapse, consolidation, effusion or pneumothorax.  Trachea midline.  IMPRESSION: Low volume exam.  No acute finding.  Original Report Authenticated By: Judie Petit. Ruel Favors, M.D.    Brief H and P: For complete details please refer to admission H and P, but in brief 41 year-old male with history of diabetes mellitus type 2 diagnosed 6 months ago has not been taking any medications for last 2 months due to financial reasons, history of bladder cancer presented to the ER with complaints of chest pain since last night 8 PM. The pain is retrosternal pressure-like nonradiating associated with some nausea. Patient in addition has some epigastric discomfort. Denies any shortness of breath diaphoresis dizziness. His first set of cardiac enzymes being negative an EKG shows sinus tachycardia.   Physical Exam on Discharge:  Filed Vitals:   01/02/12 2046 01/03/12 0016 01/03/12 0412 01/03/12 0830  BP: 133/89 126/80 109/65 139/96  Pulse: 79 79 63 84  Temp: 97.9 F (36.6 C) 97.5 F (36.4  C) 98.2 F (36.8 C) 99 F (37.2 C)  TempSrc: Axillary Oral Oral Oral  Resp:    18  Height:      Weight:      SpO2: 97% 95% 94% 94%     Intake/Output Summary (Last 24 hours) at 01/03/12 1608 Last data filed at 01/03/12 4098  Gross per 24 hour  Intake   1400 ml  Output      0 ml  Net   1400 ml    General: middle aged male  in no acute distress. HEENT: No pallor, no icterus, moist oral mucisa Heart: Regular rate and rhythm, without murmurs, rubs, gallops. Lungs: Clear to auscultation bilaterally. Abdomen: Soft, nontender, nondistended, positive bowel sounds. Extremities: No clubbing cyanosis  or edema with positive pedal pulses.rt radial area ( catheter site ) clean  Neuro:Alert, awake, oriented x3, Grossly intact, nonfocal.  CBC:    Component Value Date/Time   WBC 9.4 01/02/2012 0500   HGB 12.6* 01/02/2012 0500   HCT 39.6 01/02/2012 0500   PLT 260 01/02/2012 0500   MCV 90.2 01/02/2012 0500   NEUTROABS 5.7 01/01/2012 2154   LYMPHSABS 3.2 01/01/2012 2154   MONOABS 0.6 01/01/2012 2154   EOSABS 0.1 01/01/2012 2154   BASOSABS 0.1 01/01/2012 2154    Basic Metabolic Panel:    Component Value Date/Time   NA 137 01/02/2012 0500   K 3.6 01/02/2012 0500   CL 101 01/02/2012 0500   CO2 25 01/02/2012 0500   BUN 10 01/02/2012 0500   CREATININE 0.49* 01/02/2012 0500   GLUCOSE 332* 01/02/2012 0500   CALCIUM 9.3 01/02/2012 0500    Hospital Course:  Chest pain  appeared to be quite typical for angina Serial CE negative. EKG showing sinus tachycardia only stabel on tele except for tachycardia  Given  ASA , sl nitro  Cardiology consult called and had cardiac cath on 1/17 showing Mild diffuse small vessel disease. Mild luminal irregularities.no intervention done. Recommend medial management and secondary risk reduction.  Patient remains chest pain free and will be discharged on ASA, 325 mg, crestor, metoprolol and lisinopril. Counseled on lifestyle modification with dietary modifications, medication adherence , follow up , regular exercise and strongly counseled on smoking cessation.   DIABETES MELLITUS  Patient on metformin at home but not taking meds due to insurance issues   Given high A1C (10.8) will discharge on novolog 15 u bid and also increase dose of metformin to 1000 mg bid (which he should take after 3 days given recent cath.)  Tobacco use  counseled on smoking cessation  Hx of Bladder ca  no follow up recently and should have it addressed once PCP assigned.    Time spent on Discharge: 45 minutes  Signed: Eddie North 01/03/2012, 4:08 PM

## 2012-01-03 NOTE — Consult Note (Signed)
Pt smokes 1/2 ppd and is in action stage and ready to quit. Recommended 14 mg patches x 2 weeks and 7 mg patches x 2 weeks. Discussed patch use instructions. Referred to 1-800 quit now for f/u and support. Discussed oral fixation substitutes, second hand smoke and in home smoking policy. Reviewed and gave pt Written education/contact information.

## 2012-01-03 NOTE — Cardiovascular Report (Signed)
NAMEGASTON, DASE NO.:  [id number]  MEDICAL RECORD NO.:  [id number]  LOCATION:  2504                         FACILITY:  [facility]  PHYSICIAN:  Pamella Pert, MD DATE OF BIRTH:  [birth year]  DATE OF PROCEDURE:  01/02/2012 DATE OF DISCHARGE:                           CARDIAC CATHETERIZATION   PROCEDURE PERFORMED: 1. Left ventriculography. 2. Selective right and  left coronary arteriography.  INDICATION:  Mr. Lawrence Pearson is a 41 year old gentleman with history of diabetes, hypertension, hyperlipidemia, morbid obesity, and continued tobacco use, who was admitted via [facility] when he presented with chest pain which is atypical for angina.  He was ruled out for myocardial infarction.  Because of continued chest pain and multiple cardiovascular risk factors, I had done a consultation on him this morning, and we decided to bring him to the cardiac catheterization lab to evaluate his coronary anatomy for definitive diagnosis of coronary disease.  HEMODYNAMIC DATA:  The left ventricular pressure was 119//19 with end- diastolic pressure 25 mmHg.  The aortic pressure was 114/85 with a mean of 99 mmHg.  There was no pressure gradient across the aortic valve.  ANGIOGRAPHIC DATA:  Left ventricular systolic function was normal with ejection fraction of 55-60% but there was no regional wall motion abnormality, no significant mitral regurgitation.  Right coronary artery.  The right coronary artery is a small vessel and is nondominant.  Left main coronary artery.  The left main coronary artery is a large caliber vessel.  It is smooth and normal.  LAD.  LAD is a large caliber vessel.  There is mild luminal irregularity noted in the LAD.  A slow filling noted suggestive of small microvascular disease.  Ramus intermediate (high obtuse marginal 1).  This shows mild diffuse luminal irregularity.  No focal stenosis was evident.  Circumflex coronary artery.   Circumflex coronary artery is a very large caliber vessel and a dominant vessel.  Again, slow filling was evident in the circumflex coronary artery.  Mild diffuse luminal irregularity was evident.  The circumflex in the mid segment gave origin to 2 small- sized obtuse marginals which also again has mild diffuse disease.  The circumflex itself gives origin to large PDA branch and 2 large obtuse marginal branches.  IMPRESSION: 1. Normal left ventricular systolic function, ejection fraction 60%. 2. Mild noncritical coronary artery disease involving small vessels     without any focal stenosis of large epicardial vessels.  RECOMMENDATION:  The patient needs aggressive risk factor modification including control of diabetes, hypertension, hyperlipidemia, smoking cessation, weight loss.  From cardiac standpoint evaluation for noncardiac cause of chest pain is indicated.  The disease process, i.e. small vessel disease does not explain rest angina.  Hence, evaluation for noncardiac cause of chest pain is indicated.  TECHNIQUE OF PROCEDURE:  Under sterile precautions using a 6-French right radial access, a 6-French TIG #4 catheter was advanced to the ascending aorta and selective right and left coronary arteriography was performed.  The catheter then pulled out of the body over an exchanged length J-wire.  I utilized a 5-French pigtail catheter to perform left ventriculography in the RAO projection.  The catheter then pulled out of the body  over a J-wire.  Hemostasis was obtained by applying TR band.  The patient tolerated the procedure well.  No immediate complications.     Pamella Pert, MD     JRG/MEDQ  D:  01/02/2012  T:  01/03/2012  Job:  [id number]

## 2012-01-03 NOTE — Progress Notes (Signed)
Diabetes Education  Consult per Nutrition Risk Assessment  Lab Results  Component Value Date   HGBA1C 10.8* 01/02/2012   HGBA1C  Value: 12.7 (NOTE)                                                                       According to the ADA Clinical Practice Recommendations for 2011, when HbA1c is used as a screening test:   >=6.5%   Diagnostic of Diabetes Mellitus           (if abnormal result  is confirmed)  5.7-6.4%   Increased risk of developing Diabetes Mellitus  References:Diagnosis and Classification of Diabetes Mellitus,Diabetes Care,2011,34(Suppl 1):S62-S69 and Standards of Medical Care in         Diabetes - 2011,Diabetes Care,2011,34  (Suppl 1):S11-S61.* 08/23/2010   Lab Results  Component Value Date   LDLCALC 92 01/03/2012   CREATININE 0.49* 01/02/2012   Results for DAMARIOUS, HOLTSCLAW (MRN [id number]) as of 01/03/2012 12:21  Ref. Range 01/03/2012 04:05  Cholesterol Latest Range: 0-200 mg/dL 782 (H)  Triglycerides Latest Range: <150 mg/dL 956 (H)  HDL Latest Range: >39 mg/dL 42  LDL (calc) Latest Range: 0-99 mg/dL 92  VLDL Latest Range: 0-40 mg/dL 75 (H)  Total CHOL/HDL Ratio No range found 5.0   Ht:  5'6" (167.6cm) Wt:  251.75# (114.2kg)  Diet:  CHO MOD MED  Tolerating well with good intake.    Diet Hx:  Reg diet.  Used regular sugar with exception of diet soda.  Cooks for self.    Nutrition Dx:  Food and Nutrition Related knowledge deficit related to limited prior knowledge as evidenced by diet hx.  Goal:  Verbalize changes for improved glucose control.  Intervention:  Instructed pt on diabetic diet basics using the plate method. Discussed benefits of weight loss and exercise.  Portion control, food choices, and snacking also addressed.  Written info with RD name and number provided.   Oran Rein, RD [phone]

## 2012-01-03 NOTE — Progress Notes (Signed)
Inpatient Diabetes Program Recommendations  AACE/ADA: New Consensus Statement on Inpatient Glycemic Control (2009)  Target Ranges:  Prepandial:   less than 140 mg/dL      Peak postprandial:   less than 180 mg/dL (1-2 hours)      Critically ill patients:  140 - 180 mg/dL   Reason for Visit: Results for Lawrence Pearson, Lawrence Pearson (MRN [id number]) as of 01/03/2012 15:57  Ref. Range 01/02/2012 15:18 01/02/2012 15:46 01/02/2012 22:00 01/03/2012 08:21 01/03/2012 12:29  Glucose-Capillary Latest Range: 70-99 mg/dL 409 (H) 811 (H) 914 (H) 284 (H) 279 (H)  Note patient to be discharged home today.  He states he is familiar with diabetes and gives his mother Lantus at home. He also admits to using her meter and strips to check CBG's at home.  Gave him information regarding generic meter from [employer] ([employer]). He does not have insurance and plans to follow up at [facility]. States he has talked to dietician re. Diet.  Will order "living well with diabetes booklet" for him to take home.     Agree with discharge regimen of insulin NPH 15 units bid.  Discussed briefly the peak associated with NPH and need to eat consistently.  Needs close follow-up with PCP.  Also would benefit from Outpatient diabetes education.

## 2012-01-03 NOTE — Progress Notes (Signed)
01/03/2012 [facility], Bosie Clos SPARKS Case Management Note [phone]    CARE MANAGEMENT NOTE 01/03/2012  Patient:  Lawrence Pearson, Lawrence Pearson   Account Number:  [id number]  Date Initiated:  01/03/2012  Documentation initiated by:  Fransico Michael  Subjective/Objective Assessment:   admitted on 12/31/10 with chest pain     Action/Plan:   prior to admission, patient lived at home and was independent with ADLs   Anticipated DC Date:  01/04/2012   Anticipated DC Plan:  HOME/SELF CARE      DC Planning Services  CM consult  CM consult      Choice offered to / List presented to:             Status of service:  Completed, signed off Medicare Important Message given?   (If response is "NO", the following Medicare IM given date fields will be blank) Date Medicare IM given:   Date Additional Medicare IM given:    Discharge Disposition:  HOME/SELF CARE  Per UR Regulation:  Reviewed for med. necessity/level of care/duration of stay  Comments:  01/03/12-1512-J.Lutricia Horsfall  [phone]      41yo male patient admitted on 01/01/12 with chest pain. Patient also has a history of DM. Prior to admission, patient lived at home and was independent with ADLs. In to speak with patient. Denies having a PCP or insurance and requests assistance with medications. [facility] called and spoke with Sadie, patient is elligible for Z[facility] fund. Also called [facility] and spoke to [facility] to make appointments. Elligibility appointment is scheduled for Thursday January 23, 2012 at 2:15pm and follow up appointment is scheduled for Thursday February 20, 2012 at 2:30pm with Dr. Andrey Campanile. Instructions given to patient and placed on discharge instructions. [facility] with instructions to call floor nurse when ready to pick up.

## 2012-02-17 ENCOUNTER — Emergency Department (HOSPITAL_COMMUNITY)
Admission: EM | Admit: 2012-02-17 | Discharge: 2012-02-17 | Disposition: A | Discharge status: Home / Self Care | Payer: Self-pay | Attending: Emergency Medicine | Admitting: Emergency Medicine

## 2012-02-17 ENCOUNTER — Encounter (HOSPITAL_COMMUNITY): Payer: Self-pay | Admitting: Emergency Medicine

## 2012-02-17 DIAGNOSIS — Z79899 Other long term (current) drug therapy: Secondary | ICD-10-CM | POA: Insufficient documentation

## 2012-02-17 DIAGNOSIS — F17200 Nicotine dependence, unspecified, uncomplicated: Secondary | ICD-10-CM | POA: Insufficient documentation

## 2012-02-17 DIAGNOSIS — E119 Type 2 diabetes mellitus without complications: Secondary | ICD-10-CM | POA: Insufficient documentation

## 2012-02-17 DIAGNOSIS — R112 Nausea with vomiting, unspecified: Secondary | ICD-10-CM | POA: Insufficient documentation

## 2012-02-17 DIAGNOSIS — E669 Obesity, unspecified: Secondary | ICD-10-CM | POA: Insufficient documentation

## 2012-02-17 DIAGNOSIS — I10 Essential (primary) hypertension: Secondary | ICD-10-CM | POA: Insufficient documentation

## 2012-02-17 DIAGNOSIS — R197 Diarrhea, unspecified: Secondary | ICD-10-CM | POA: Insufficient documentation

## 2012-02-17 DIAGNOSIS — IMO0001 Reserved for inherently not codable concepts without codable children: Secondary | ICD-10-CM | POA: Insufficient documentation

## 2012-02-17 HISTORY — DX: Essential (primary) hypertension: I10

## 2012-02-17 LAB — POCT I-STAT, CHEM 8
BUN: <3 mg/dL — ABNORMAL LOW (ref 6–23)
Chloride: 99 mEq/L (ref 96–112)
Potassium: 4.1 mEq/L (ref 3.5–5.1)
Sodium: 139 mEq/L (ref 135–145)
TCO2: 32 mmol/L (ref 0–100)

## 2012-02-17 MED ORDER — HYDROCODONE-ACETAMINOPHEN 5-325 MG PO TABS
2.0000 | ORAL_TABLET | Freq: Once | ORAL | Status: AC
  Administered 2012-02-17: 2 via ORAL
  Filled 2012-02-17: qty 2

## 2012-02-17 MED ORDER — ONDANSETRON HCL 4 MG/2ML IJ SOLN
4.0000 mg | Freq: Once | INTRAMUSCULAR | Status: AC
  Administered 2012-02-17: 4 mg via INTRAVENOUS
  Filled 2012-02-17: qty 2

## 2012-02-17 MED ORDER — SODIUM CHLORIDE 0.9 % IV BOLUS (SEPSIS)
1000.0000 mL | Freq: Once | INTRAVENOUS | Status: AC
  Administered 2012-02-17: 1000 mL via INTRAVENOUS

## 2012-02-17 MED ORDER — ONDANSETRON HCL 4 MG PO TABS: 4.0000 mg | ORAL_TABLET | Freq: Three times a day (TID) | ORAL | Status: AC | PRN

## 2012-02-17 NOTE — ED Notes (Signed)
41 year old male with a history of diabetes, hypertension, hyperlipidemia and tobacco abuse was recently admitted to the hospital and had a heart catheterization showing nonobstructive large vessels, presents with a complaint of abdominal cramping, nausea vomiting and diarrhea. He states that his stools have been very loose, frequent and has had multiple episodes of nausea and vomiting over the last 3 days. It is gradually getting worse, associated with mild abdominal cramping and fevers.  Physical exam:  Pharynx with mild dehydrated mucous membranes, mild tachycardia at 105 beats per minute, lungs are clear other than mild expiratory wheeze. He is not in any distress. Abdomen is soft but has mild diffuse tenderness in the midline, no pain at McBurney's point, no right upper quadrant tenderness.  No peripheral edema  Assessment: Patient has signs of dehydration related to his nausea vomiting and frequent loose stools. This may be a gastroenteritis, check electrolytes, rehydrate with IV fluids and parenteral medications for nausea.  Medical screening examination/treatment/procedure(s) were conducted as a shared visit with non-physician practitioner(s) and myself.  I personally evaluated the patient during the encounter   Vida Roller, MD 02/17/12 [phone]

## 2012-02-17 NOTE — ED Notes (Signed)
ZOX:[id number]<AV> Expected date:02/17/12<BR> Expected time:<BR> Means of arrival:Ambulance<BR> Comments:<BR> [facility]  40 y/o male,  Flu like symptoms

## 2012-02-17 NOTE — ED Notes (Signed)
Pt alert and oriented x4. Respirations even and unlabored, bilateral symmetrical rise and fall of chest. Skin warm and dry. In no acute distress. Denies needs.  Pt resting with eyes closed in bed.  

## 2012-02-17 NOTE — Discharge Instructions (Signed)
B.R.A.T. Diet Your doctor has recommended the B.R.A.T. diet for you or your child until the condition improves. This is often used to help control diarrhea and vomiting symptoms. If you or your child can tolerate clear liquids, you may have:  Bananas.   Rice.   Applesauce.   Toast (and other simple starches such as crackers, potatoes, noodles).  Be sure to avoid dairy products, meats, and fatty foods until symptoms are better. Fruit juices such as apple, grape, and prune juice can make diarrhea worse. Avoid these. Continue this diet for 2 days or as instructed by your caregiver. Document Released: 12/02/2005 Document Revised: 11/21/2011 Document Reviewed: 05/21/2007 [employer] Patient Information 2012 [employer].Nausea and Vomiting Nausea is a sick feeling that often comes before throwing up (vomiting). Vomiting is a reflex where stomach contents come out of your mouth. Vomiting can cause severe loss of body fluids (dehydration). Children and elderly adults can become dehydrated quickly, especially if they also have diarrhea. Nausea and vomiting are symptoms of a condition or disease. It is important to find the cause of your symptoms. CAUSES   Direct irritation of the stomach lining. This irritation can result from increased acid production (gastroesophageal reflux disease), infection, food poisoning, taking certain medicines (such as nonsteroidal anti-inflammatory drugs), alcohol use, or tobacco use.   Signals from the brain.These signals could be caused by a headache, heat exposure, an inner ear disturbance, increased pressure in the brain from injury, infection, a tumor, or a concussion, pain, emotional stimulus, or metabolic problems.   An obstruction in the gastrointestinal tract (bowel obstruction).   Illnesses such as diabetes, hepatitis, gallbladder problems, appendicitis, kidney problems, cancer, sepsis, atypical symptoms of a heart attack, or eating disorders.   Medical  treatments such as chemotherapy and radiation.   Receiving medicine that makes you sleep (general anesthetic) during surgery.  DIAGNOSIS Your caregiver may ask for tests to be done if the problems do not improve after a few days. Tests may also be done if symptoms are severe or if the reason for the nausea and vomiting is not clear. Tests may include:  Urine tests.   Blood tests.   Stool tests.   Cultures (to look for evidence of infection).   X-rays or other imaging studies.  Test results can help your caregiver make decisions about treatment or the need for additional tests. TREATMENT You need to stay well hydrated. Drink frequently but in small amounts.You may wish to drink water, sports drinks, clear broth, or eat frozen ice pops or gelatin dessert to help stay hydrated.When you eat, eating slowly may help prevent nausea.There are also some antinausea medicines that may help prevent nausea. HOME CARE INSTRUCTIONS   Take all medicine as directed by your caregiver.   If you do not have an appetite, do not force yourself to eat. However, you must continue to drink fluids.   If you have an appetite, eat a normal diet unless your caregiver tells you differently.   Eat a variety of complex carbohydrates (rice, wheat, potatoes, bread), lean meats, yogurt, fruits, and vegetables.   Avoid high-fat foods because they are more difficult to digest.   Drink enough water and fluids to keep your urine clear or pale yellow.   If you are dehydrated, ask your caregiver for specific rehydration instructions. Signs of dehydration may include:   Severe thirst.   Dry lips and mouth.   Dizziness.   Dark urine.   Decreasing urine frequency and amount.   Confusion.     Rapid breathing or pulse.  SEEK IMMEDIATE MEDICAL CARE IF:   You have blood or brown flecks (like coffee grounds) in your vomit.   You have black or bloody stools.   You have a severe headache or stiff neck.   You  are confused.   You have severe abdominal pain.   You have chest pain or trouble breathing.   You do not urinate at least once every 8 hours.   You develop cold or clammy skin.   You continue to vomit for longer than 24 to 48 hours.   You have a fever.  MAKE SURE YOU:   Understand these instructions.   Will watch your condition.   Will get help right away if you are not doing well or get worse.  Document Released: 12/02/2005 Document Revised: 11/21/2011 Document Reviewed: 05/01/2011 [facility] Patient Information 2012 [place], [place].Diet for Diarrhea, Adult Having frequent, runny stools (diarrhea) has many causes. Diarrhea may be caused or worsened by food or drink. Diarrhea may be relieved by changing your diet. IF YOU ARE NOT TOLERATING SOLID FOODS:  Drink enough water and fluids to keep your urine clear or pale yellow.   Avoid sugary drinks and sodas as well as milk-based beverages.   Avoid beverages containing caffeine and alcohol.   You may try rehydrating beverages. You can make your own by following this recipe:    tsp table salt.    tsp baking soda.   ? tsp salt substitute (potassium chloride).   1 tbs + 1 tsp sugar.   1 qt water.  As your stools become more solid, you can start eating solid foods. Add foods one at a time. If a certain food causes your diarrhea to get worse, avoid that food and try other foods. A low fiber, low-fat, and lactose-free diet is recommended. Small, frequent meals may be better tolerated.  Starches  Allowed:  White, [place], and pita breads, plain rolls, buns, bagels. Plain muffins, matzo. Soda, saltine, or graham crackers. Pretzels, melba toast, zwieback. Cooked cereals made with water: cornmeal, farina, cream cereals. Dry cereals: refined corn, wheat, rice. Potatoes prepared any way without skins, refined macaroni, spaghetti, noodles, refined rice.   Avoid:  Bread, rolls, or crackers made with whole wheat, multi-grains, rye, bran  seeds, nuts, or coconut. Corn tortillas or taco shells. Cereals containing whole grains, multi-grains, bran, coconut, nuts, or raisins. Cooked or dry oatmeal. Coarse wheat cereals, granola. Cereals advertised as "high-fiber." Potato skins. Whole grain pasta, wild or brown rice. Popcorn. Sweet potatoes/yams. Sweet rolls, doughnuts, waffles, pancakes, sweet breads.  Vegetables  Allowed: Strained tomato and vegetable juices. Most well-cooked and canned vegetables without seeds. Fresh: Tender lettuce, cucumber without the skin, cabbage, spinach, bean sprouts.   Avoid: Fresh, cooked, or canned: Artichokes, baked beans, beet greens, broccoli, Brussels sprouts, corn, kale, legumes, peas, sweet potatoes. Cooked: Green or red cabbage, spinach. Avoid large servings of any vegetables, because vegetables shrink when cooked, and they contain more fiber per serving than fresh vegetables.  Fruit  Allowed: All fruit juices except prune juice. Cooked or canned: Apricots, applesauce, cantaloupe, cherries, fruit cocktail, grapefruit, grapes, kiwi, mandarin oranges, peaches, pears, plums, watermelon. Fresh: Apples without skin, ripe banana, grapes, cantaloupe, cherries, grapefruit, peaches, oranges, plums. Keep servings limited to  cup or 1 piece.   Avoid: Fresh: Apple with skin, apricots, mango, pears, raspberries, strawberries. Prune juice, stewed or dried prunes. Dried fruits, raisins, dates. Large servings of all fresh fruits.  Meat and Meat Substitutes  Allowed:  Ground or well-cooked tender beef, ham, veal, lamb, pork, or poultry. Eggs, plain cheese. Fish, oysters, shrimp, lobster, other seafoods. Liver, organ meats.   Avoid: Tough, fibrous meats with gristle. Peanut butter, smooth or chunky. Cheese, nuts, seeds, legumes, dried peas, beans, lentils.  Milk  Allowed: Yogurt, lactose-free milk, kefir, drinkable yogurt, buttermilk, soy milk.   Avoid: Milk, chocolate milk, beverages made with milk, such as milk  shakes.  Soups  Allowed: Bouillon, broth, or soups made from allowed foods. Any strained soup.   Avoid: Soups made from vegetables that are not allowed, cream or milk-based soups.  Desserts and Sweets  Allowed: Sugar-free gelatin, sugar-free frozen ice pops made without sugar alcohol.   Avoid: Plain cakes and cookies, pie made with allowed fruit, pudding, custard, cream pie. Gelatin, fruit, ice, sherbet, frozen ice pops. Ice cream, ice milk without nuts. Plain hard candy, honey, jelly, molasses, syrup, sugar, chocolate syrup, gumdrops, marshmallows.  Fats and Oils  Allowed: Avoid any fats and oils.   Avoid: Seeds, nuts, olives, avocados. Margarine, butter, cream, mayonnaise, salad oils, plain salad dressings made from allowed foods. Plain gravy, crisp bacon without rind.  Beverages  Allowed: Water, decaffeinated teas, oral rehydration solutions, sugar-free beverages.   Avoid: Fruit juices, caffeinated beverages (coffee, tea, soda or pop), alcohol, sports drinks, or lemon-lime soda or pop.  Condiments  Allowed: Ketchup, mustard, horseradish, vinegar, cream sauce, cheese sauce, cocoa powder. Spices in moderation: allspice, basil, bay leaves, celery powder or leaves, cinnamon, cumin powder, curry powder, ginger, mace, marjoram, onion or garlic powder, oregano, paprika, parsley flakes, ground pepper, rosemary, sage, savory, tarragon, thyme, turmeric.   Avoid: Coconut, honey.  Weight Monitoring: Weigh yourself every day. You should weigh yourself in the morning after you urinate and before you eat breakfast. Wear the same amount of clothing when you weigh yourself. Record your weight daily. Bring your recorded weights to your clinic visits. Tell your caregiver right away if you have gained 3 lb/1.4 kg or more in 1 day, 5 lb/2.3 kg in a week, or whatever amount you were told to report. SEEK IMMEDIATE MEDICAL CARE IF:   You are unable to keep fluids down.   You start to throw up (vomit) or  diarrhea keeps coming back (persistent).   Abdominal pain develops, increases, or can be felt in one place (localizes).   You have an oral temperature above 102 F (38.9 C), not controlled by medicine.   Diarrhea contains blood or mucus.   You develop excessive weakness, dizziness, fainting, or extreme thirst.  MAKE SURE YOU:   Understand these instructions.   Will watch your condition.   Will get help right away if you are not doing well or get worse.  Document Released: 02/22/2004 Document Revised: 11/21/2011 Document Reviewed: 06/15/2009 [facility] Patient Information 2012 [place], [place].    Please drink plenty of fluids over the next few days.  You may return to the ER at any time for worsening condition or any new symptoms that concern you.

## 2012-02-17 NOTE — ED Notes (Signed)
As per EMS, pt has been ill for 3 three days with no relief.

## 2012-02-17 NOTE — ED Provider Notes (Signed)
History     CSN: [id number]  Arrival date & time 02/17/12  7829   First MD Initiated Contact with Patient 02/17/12 [phone]      Chief Complaint  Patient presents with  . Nausea  . Fever  . Generalized Body Aches  . Chills    (Consider location/radiation/quality/duration/timing/severity/associated sxs/prior treatment) HPI Comments: Patient reports 3 days of feeling hot, body aches, N/V/D, mild cough, occasional wheezing.  Associated abdominal pain only with vomiting.  Cough is productive of yellow sputum.  Emesis is also yellow.  Emesis and diarrhea are nonbloody.  Pt denies chest pain, SOB, urinary symptoms.  Pt has been taking his home norco (for chronic back pain) without relief.  Denies recent travel, known sick contacts, new foods.    Patient is a 41 y.o. male presenting with fever. The history is provided by the patient.  Fever Primary symptoms do not include shortness of breath or dysuria.    Past Medical History  Diagnosis Date  . Diabetes mellitus   . Bladder cancer   . Hypertension     Past Surgical History  Procedure Date  . Bladder surgery     bladder cancer surgery    Family History  Problem Relation Age of Onset  . Coronary artery disease Father     History  Substance Use Topics  . Smoking status: Current Everyday Smoker -- 5 years    Types: Cigarettes  . Smokeless tobacco: Never Used  . Alcohol Use: No      Review of Systems  Constitutional: Negative for chills.  HENT: Positive for sore throat. Negative for congestion and trouble swallowing.   Respiratory: Negative for shortness of breath.   Cardiovascular: Negative for chest pain.  Gastrointestinal: Negative for constipation and blood in stool.  Genitourinary: Negative for dysuria.  All other systems reviewed and are negative.    Allergies  Penicillins  Home Medications   Current Outpatient Rx  Name Route Sig Dispense Refill  . HYDROCODONE-ACETAMINOPHEN 10-325 MG PO TABS Oral Take 1  tablet by mouth every 6 (six) hours as needed.    . INSULIN ISOPHANE HUMAN 100 UNIT/ML [place] SUSP Subcutaneous Inject 15 Units into the skin 2 (two) times daily. 10 mL 12  . METFORMIN HCL 500 MG PO TABS Oral Take 1 tablet (500 mg total) by mouth 2 (two) times daily with a meal. 1000 tablet 3    Please start taking from 01/21  . METOPROLOL TARTRATE 12.5 MG HALF TABLET Oral Take 0.5 tablets (12.5 mg total) by mouth 2 (two) times daily. 60 tablet 3  . NITROGLYCERIN 0.4 MG SL SUBL Sublingual Place 1 tablet (0.4 mg total) under the tongue every 5 (five) minutes as needed for chest pain. 60 tablet 3  . LISINOPRIL 2.5 MG PO TABS Oral Take 2 tablets (5 mg total) by mouth daily. 30 tablet 3  . ROSUVASTATIN CALCIUM 40 MG PO TABS Oral Take 1 tablet (40 mg total) by mouth daily. 30 tablet 3    BP 155/99  Pulse 106  Temp(Src) 100 F (37.8 C) (Oral)  Resp 20  SpO2 98%  Physical Exam  Nursing note and vitals reviewed. Constitutional: He is oriented to person, place, and time. He appears well-developed and well-nourished.  HENT:  Head: Normocephalic and atraumatic.  Neck: Neck supple.  Cardiovascular: Normal rate, regular rhythm and normal heart sounds.   Pulmonary/Chest: Breath sounds normal. No respiratory distress. He has no wheezes. He has no rales. He exhibits no tenderness.  Abdominal: Soft.  Bowel sounds are normal. He exhibits no distension and no mass. There is tenderness. There is no rebound, no guarding and no tenderness at McBurney's point.         Obese abdomen, diffuse left sided tenderness.    Neurological: He is alert and oriented to person, place, and time.    ED Course  Procedures (including critical care time)  Labs Reviewed - No data to display No results found.  9:16 AM At 8:55am I spoke with nurse regarding patient's chem8 which has not resulted, no staff available in mini lab.  She states she drew the blood just after 8:00am.  There is still no result.  Will check with mini  lab and nurse.   Patient reports he is feeling much better after IV fluids, continues to feel "hot" all over.   Filed Vitals:   02/17/12 1009  BP: 137/82  Pulse: 85  Temp: 98.5 F (36.9 C)  Resp: 15     1. Nausea vomiting and diarrhea       MDM  Patient with 3 days of N/V/D, fever, slight cough.  Lungs CTAB, pt not SOB - doubt pneumonia.  Suspect viral GI illness.  Pt given IVF, zofran in ED with great relief.  Pt advised of blood glucose.  Electrolytes and kidney function unremarkable. D/C home with zofran, advised to drink plenty of fluids.  Patient verbalizes understanding and agrees with plan.          Rise Patience, [place] 02/17/12 1528

## 2012-02-18 NOTE — ED Provider Notes (Signed)
Medical screening examination/treatment/procedure(s) were conducted as a shared visit with non-physician practitioner(s) and myself.  I personally evaluated the patient during the encounter  Please see my separate respective documentation pertaining to this patient encounter   Vida Roller, MD 02/18/12 [phone]

## 2012-04-27 ENCOUNTER — Encounter (HOSPITAL_COMMUNITY): Payer: Self-pay

## 2012-04-27 ENCOUNTER — Emergency Department (INDEPENDENT_AMBULATORY_CARE_PROVIDER_SITE_OTHER)
Admission: EM | Admit: 2012-04-27 | Discharge: 2012-04-27 | Disposition: A | Discharge status: Home / Self Care | Payer: Self-pay | Source: Home / Self Care | Attending: Family Medicine | Admitting: Family Medicine

## 2012-04-27 DIAGNOSIS — K047 Periapical abscess without sinus: Secondary | ICD-10-CM

## 2012-04-27 MED ORDER — CLINDAMYCIN HCL 150 MG PO CAPS: 300.0000 mg | ORAL_CAPSULE | Freq: Three times a day (TID) | ORAL | Status: AC

## 2012-04-27 MED ORDER — LIDOCAINE HCL (PF) 1 % IJ SOLN
INTRAMUSCULAR | Status: AC
  Filled 2012-04-27: qty 5

## 2012-04-27 MED ORDER — CEFTRIAXONE SODIUM 1 G IJ SOLR
INTRAMUSCULAR | Status: AC
  Filled 2012-04-27: qty 10

## 2012-04-27 MED ORDER — HYDROCODONE-ACETAMINOPHEN 5-500 MG PO TABS: 1.0000 | ORAL_TABLET | Freq: Four times a day (QID) | ORAL | Status: AC | PRN

## 2012-04-27 MED ORDER — IBUPROFEN 600 MG PO TABS: 600.0000 mg | ORAL_TABLET | Freq: Three times a day (TID) | ORAL | Status: AC

## 2012-04-27 MED ORDER — CEFTRIAXONE SODIUM 1 G IJ SOLR
1.0000 g | Freq: Once | INTRAMUSCULAR | Status: AC
  Administered 2012-04-27: 1 g via INTRAMUSCULAR

## 2012-04-27 NOTE — ED Notes (Signed)
Denies any S/S allergic reaction.  Instructed that if they start to develop (though unlikely), to take Benadryl and go to ED immediately.

## 2012-04-27 NOTE — Discharge Instructions (Signed)
You need to see a dentist for complete resolution of your problem. Dental problems can make her diabetes to get worse. Take the prescribed medications as instructed. Be aware that Vicodin can make you drowsy and she should not drive after taking this medication. Go to the emergency department if fever or worsening swelling pain or if your blood sugar remains over 300 despite taking your medications.

## 2012-04-27 NOTE — ED Notes (Signed)
Dr. Alfonse Ras discussed possibility of cross-reaction to Rocephin with hx PCN allergy.  Patient educated on S/S allergic reaction, and instructed to notify RN immediately if any.  Patient verbalized understanding.

## 2012-04-27 NOTE — ED Notes (Addendum)
Patient c/o toothache since yesterday; no relief w OTC meds; states he is out of or cannot afford any of his Rx, has not taken in a couple of months; attempting to qua;[facility] for [facility]

## 2012-04-30 NOTE — ED Provider Notes (Cosign Needed)
History     CSN: [id number]  Arrival date & time 04/27/12  1950   First MD Initiated Contact with Patient 04/27/12 2010      Chief Complaint  Patient presents with  . Oral Swelling    (Consider location/radiation/quality/duration/timing/severity/associated sxs/prior treatment) HPI Comments: 41 y/o smoker and diabetic male. Here c/o dental pain for 3-4 days swelling worse today. Blood sugar in 100s when checked at home, reports compliance with diabetes medications. Taking ver the cunter alive with no relief. No fever or chills.   Past Medical History  Diagnosis Date  . Diabetes mellitus   . Bladder cancer   . Hypertension     Past Surgical History  Procedure Date  . Bladder surgery     bladder cancer surgery    Family History  Problem Relation Age of Onset  . Coronary artery disease Father     History  Substance Use Topics  . Smoking status: Current Everyday Smoker -- 5 years    Types: Cigarettes  . Smokeless tobacco: Never Used  . Alcohol Use: No      Review of Systems  Constitutional: Negative for fever and chills.       10 systems reviewed and  pertinent negative and positive symptoms and as per HPI.     HENT: Positive for dental problem. Negative for neck pain.   Skin: Negative for rash.  All other systems reviewed and are negative.    Allergies  Penicillins  Home Medications   Current Outpatient Rx  Name Route Sig Dispense Refill  . INSULIN ISOPHANE HUMAN 100 UNIT/ML [place] SUSP Subcutaneous Inject 15 Units into the skin 2 (two) times daily. 10 mL 12  . METFORMIN HCL 500 MG PO TABS Oral Take 1 tablet (500 mg total) by mouth 2 (two) times daily with a meal. 1000 tablet 3    Please start taking from 01/21  . CLINDAMYCIN HCL 150 MG PO CAPS Oral Take 2 capsules (300 mg total) by mouth 3 (three) times daily. 42 capsule 0  . HYDROCODONE-ACETAMINOPHEN 5-500 MG PO TABS Oral Take 1 tablet by mouth every 6 (six) hours as needed for pain. 15 tablet 0  .  IBUPROFEN 600 MG PO TABS Oral Take 1 tablet (600 mg total) by mouth 3 (three) times daily. 20 tablet 0  . LISINOPRIL 2.5 MG PO TABS Oral Take 2 tablets (5 mg total) by mouth daily. 30 tablet 3  . METOPROLOL TARTRATE 12.5 MG HALF TABLET Oral Take 0.5 tablets (12.5 mg total) by mouth 2 (two) times daily. 60 tablet 3  . NITROGLYCERIN 0.4 MG SL SUBL Sublingual Place 1 tablet (0.4 mg total) under the tongue every 5 (five) minutes as needed for chest pain. 60 tablet 3  . ROSUVASTATIN CALCIUM 40 MG PO TABS Oral Take 1 tablet (40 mg total) by mouth daily. 30 tablet 3    BP 141/93  Pulse 108  Temp(Src) 99 F (37.2 C) (Oral)  Resp 16  SpO2 100%  Physical Exam  Nursing note and vitals reviewed. Constitutional: He is oriented to person, place, and time. He appears well-developed and well-nourished. No distress.  HENT:  Head: Normocephalic and atraumatic.  Mouth/Throat: Oropharynx is clear and moist.       Poor dentition with several fractured teeth and dental plaque, gum swelling. There is swelling , fluctuation and purulent drainage in left lower gum with associated mild swelling of adjacent left lower face.   Cardiovascular: Normal heart sounds.   Pulmonary/Chest: Breath sounds normal.  Lymphadenopathy:    He has no cervical adenopathy.  Neurological: He is alert and oriented to person, place, and time.    ED Course  INCISION AND DRAINAGE Performed by: Sharin Grave Authorized by: Sharin Grave Consent: Verbal consent obtained. Risks and benefits: risks, benefits and alternatives were discussed Consent given by: patient Patient understanding: patient states understanding of the procedure being performed Body area: mouth Location details: alveolar process Anesthesia: local infiltration Local anesthetic: lidocaine 2% without epinephrine Scalpel size: 11 Incision type: single straight (punctured) Drainage: purulent Drainage amount: moderate Patient tolerance: Patient  tolerated the procedure well with no immediate complications.   (including critical care time)  Labs Reviewed - No data to display No results found.   1. Dental abscess       MDM  Superficial dental abscess drained. Patient with reported allergies to penicillin. Rocephin 1g administered in IM injection with no side effects. Prescribed clindamycin, ibuprofen and Vicodin. Close dentist follow up advised.        Sharin Grave, MD 05/01/12 [phone]

## 2013-02-05 ENCOUNTER — Encounter (HOSPITAL_COMMUNITY): Payer: Self-pay | Admitting: *Deleted

## 2013-02-05 ENCOUNTER — Inpatient Hospital Stay (HOSPITAL_COMMUNITY)
Admission: EM | Admit: 2013-02-05 | Discharge: 2013-02-07 | DRG: 918 | Disposition: A | Discharge status: Home / Self Care | Payer: MEDICAID | Attending: Internal Medicine | Admitting: Internal Medicine

## 2013-02-05 DIAGNOSIS — Z79899 Other long term (current) drug therapy: Secondary | ICD-10-CM

## 2013-02-05 DIAGNOSIS — F17200 Nicotine dependence, unspecified, uncomplicated: Secondary | ICD-10-CM | POA: Diagnosis present

## 2013-02-05 DIAGNOSIS — Z8551 Personal history of malignant neoplasm of bladder: Secondary | ICD-10-CM

## 2013-02-05 DIAGNOSIS — I2510 Atherosclerotic heart disease of native coronary artery without angina pectoris: Secondary | ICD-10-CM

## 2013-02-05 DIAGNOSIS — T391X1A Poisoning by 4-Aminophenol derivatives, accidental (unintentional), initial encounter: Principal | ICD-10-CM | POA: Diagnosis present

## 2013-02-05 DIAGNOSIS — Y92009 Unspecified place in unspecified non-institutional (private) residence as the place of occurrence of the external cause: Secondary | ICD-10-CM

## 2013-02-05 DIAGNOSIS — T450X1A Poisoning by antiallergic and antiemetic drugs, accidental (unintentional), initial encounter: Secondary | ICD-10-CM

## 2013-02-05 DIAGNOSIS — IMO0001 Reserved for inherently not codable concepts without codable children: Secondary | ICD-10-CM | POA: Diagnosis present

## 2013-02-05 DIAGNOSIS — E1165 Type 2 diabetes mellitus with hyperglycemia: Secondary | ICD-10-CM

## 2013-02-05 DIAGNOSIS — G8929 Other chronic pain: Secondary | ICD-10-CM | POA: Diagnosis present

## 2013-02-05 DIAGNOSIS — Z8249 Family history of ischemic heart disease and other diseases of the circulatory system: Secondary | ICD-10-CM

## 2013-02-05 DIAGNOSIS — I10 Essential (primary) hypertension: Secondary | ICD-10-CM | POA: Diagnosis present

## 2013-02-05 DIAGNOSIS — M549 Dorsalgia, unspecified: Secondary | ICD-10-CM | POA: Diagnosis present

## 2013-02-05 DIAGNOSIS — E785 Hyperlipidemia, unspecified: Secondary | ICD-10-CM | POA: Diagnosis present

## 2013-02-05 DIAGNOSIS — Z880 Allergy status to penicillin: Secondary | ICD-10-CM

## 2013-02-05 HISTORY — DX: Dorsalgia, unspecified: M54.9

## 2013-02-05 HISTORY — DX: Other chronic pain: G89.29

## 2013-02-05 LAB — CBC
HCT: 40.8 % (ref 39.0–52.0)
Hemoglobin: 13.3 g/dL (ref 13.0–17.0)
MCV: 85.5 fL (ref 78.0–100.0)
Platelets: 305 10*3/uL (ref 150–400)
RBC: 4.77 MIL/uL (ref 4.22–5.81)
WBC: 7.8 10*3/uL (ref 4.0–10.5)

## 2013-02-05 LAB — PROTIME-INR: Prothrombin Time: 13.2 seconds (ref 11.6–15.2)

## 2013-02-05 MED ORDER — ACTIDOSE WITH SORBITOL 50 GM/240ML PO LIQD
50.0000 g | Freq: Once | ORAL | Status: AC
  Administered 2013-02-06: 50 g via ORAL
  Filled 2013-02-05: qty 240

## 2013-02-05 NOTE — ED Notes (Signed)
Per ems, the patient has taken Tylenol PM 40 tablet today (20 in the last hour). Pt says that he has been unable to sleep. Pt is c/ax4 ambulatory on scene. bp 143/102, 88, 97% RA. CBG 230. Pt not able to take meds at home d/t financial reasons.

## 2013-02-05 NOTE — ED Notes (Signed)
BJY:NWGN<FA> Expected date:<BR> Expected time:<BR> Means of arrival:<BR> Comments:<BR>

## 2013-02-05 NOTE — ED Notes (Signed)
Pt states that he cannot urinate.  Will try in 30 minutes.

## 2013-02-06 DIAGNOSIS — T391X1A Poisoning by 4-Aminophenol derivatives, accidental (unintentional), initial encounter: Secondary | ICD-10-CM

## 2013-02-06 LAB — RAPID URINE DRUG SCREEN, HOSP PERFORMED
Barbiturates: NOT DETECTED
Benzodiazepines: NOT DETECTED

## 2013-02-06 LAB — COMPREHENSIVE METABOLIC PANEL
ALT: 17 U/L (ref 0–53)
AST: 11 U/L (ref 0–37)
Albumin: 2.9 g/dL — ABNORMAL LOW (ref 3.5–5.2)
Alkaline Phosphatase: 56 U/L (ref 39–117)
BUN: 7 mg/dL (ref 6–23)
CO2: 25 mEq/L (ref 19–32)
Calcium: 8.5 mg/dL (ref 8.4–10.5)
Chloride: 100 mEq/L (ref 96–112)
Creatinine, Ser: 0.59 mg/dL (ref 0.50–1.35)
GFR calc non Af Amer: >90 mL/min (ref >90)
Glucose, Bld: 259 mg/dL — ABNORMAL HIGH (ref 70–99)
Potassium: 3.6 mEq/L (ref 3.5–5.1)
Sodium: 134 mEq/L — ABNORMAL LOW (ref 135–145)
Total Bilirubin: 0.2 mg/dL — ABNORMAL LOW (ref 0.3–1.2)
Total Protein: 6.2 g/dL (ref 6.0–8.3)

## 2013-02-06 LAB — TSH: TSH: 0.163 u[IU]/mL — ABNORMAL LOW (ref 0.350–4.500)

## 2013-02-06 LAB — ACETAMINOPHEN LEVEL
Acetaminophen (Tylenol), Serum: 43.9 ug/mL — ABNORMAL HIGH (ref 10–30)
Acetaminophen (Tylenol), Serum: <15 ug/mL (ref 10–30)
Acetaminophen (Tylenol), Serum: <15 ug/mL (ref 10–30)

## 2013-02-06 LAB — GLUCOSE, CAPILLARY
Glucose-Capillary: 222 mg/dL — ABNORMAL HIGH (ref 70–99)
Glucose-Capillary: 198 mg/dL — ABNORMAL HIGH (ref 70–99)
Glucose-Capillary: 211 mg/dL — ABNORMAL HIGH (ref 70–99)

## 2013-02-06 LAB — CBC WITH DIFFERENTIAL/PLATELET
Basophils Absolute: 0 10*3/uL (ref 0.0–0.1)
Basophils Relative: 1 % (ref 0–1)
Eosinophils Relative: 1 % (ref 0–5)
HCT: 40.2 % (ref 39.0–52.0)
Hemoglobin: 12.9 g/dL — ABNORMAL LOW (ref 13.0–17.0)
Lymphocytes Relative: 34 % (ref 12–46)
MCHC: 32.1 g/dL (ref 30.0–36.0)
MCV: 85.7 fL (ref 78.0–100.0)
Monocytes Absolute: 0.6 10*3/uL (ref 0.1–1.0)
Monocytes Relative: 7 % (ref 3–12)
RDW: 13.4 % (ref 11.5–15.5)

## 2013-02-06 LAB — HEMOGLOBIN A1C: Mean Plasma Glucose: 278 mg/dL — ABNORMAL HIGH (ref <117)

## 2013-02-06 MED ORDER — SODIUM CHLORIDE 0.9 % IJ SOLN
3.0000 mL | Freq: Two times a day (BID) | INTRAMUSCULAR | Status: DC
  Administered 2013-02-07: 3 mL via INTRAVENOUS

## 2013-02-06 MED ORDER — SODIUM CHLORIDE 0.9 % IV SOLN: INTRAVENOUS | Status: DC

## 2013-02-06 MED ORDER — ONDANSETRON HCL 4 MG PO TABS
4.0000 mg | ORAL_TABLET | Freq: Four times a day (QID) | ORAL | Status: DC | PRN
  Administered 2013-02-07: 4 mg via ORAL
  Filled 2013-02-06: qty 1

## 2013-02-06 MED ORDER — POTASSIUM CHLORIDE CRYS ER 20 MEQ PO TBCR
40.0000 meq | EXTENDED_RELEASE_TABLET | ORAL | Status: AC
  Administered 2013-02-06 (×2): 40 meq via ORAL
  Filled 2013-02-06: qty 2

## 2013-02-06 MED ORDER — ACETYLCYSTEINE 20 % IN SOLN
70.0000 mg/kg | RESPIRATORY_TRACT | Status: DC
  Administered 2013-02-06: 7620 mg via ORAL
  Filled 2013-02-06 (×5): qty 40

## 2013-02-06 MED ORDER — INSULIN ASPART 100 UNIT/ML ~~LOC~~ SOLN
0.0000 [IU] | Freq: Three times a day (TID) | SUBCUTANEOUS | Status: DC
  Administered 2013-02-06: 3 [IU] via SUBCUTANEOUS
  Administered 2013-02-06 – 2013-02-07 (×3): 5 [IU] via SUBCUTANEOUS

## 2013-02-06 MED ORDER — ZOLPIDEM TARTRATE 10 MG PO TABS
10.0000 mg | ORAL_TABLET | Freq: Every evening | ORAL | Status: DC | PRN
  Administered 2013-02-06: 10 mg via ORAL
  Filled 2013-02-06: qty 1

## 2013-02-06 MED ORDER — ACETYLCYSTEINE 20 % IN SOLN
70.0000 mg/kg | RESPIRATORY_TRACT | Status: AC
  Administered 2013-02-06 – 2013-02-07 (×4): 7620 mg via ORAL
  Filled 2013-02-06 (×4): qty 40

## 2013-02-06 MED ORDER — INFLUENZA VIRUS VACC SPLIT PF IM SUSP
0.5000 mL | INTRAMUSCULAR | Status: AC
  Administered 2013-02-07: 0.5 mL via INTRAMUSCULAR
  Filled 2013-02-06 (×2): qty 0.5

## 2013-02-06 MED ORDER — ENOXAPARIN SODIUM 60 MG/0.6ML ~~LOC~~ SOLN
50.0000 mg | SUBCUTANEOUS | Status: DC
  Administered 2013-02-06: 50 mg via SUBCUTANEOUS
  Filled 2013-02-06 (×2): qty 0.6

## 2013-02-06 MED ORDER — ACETYLCYSTEINE 20 % IN SOLN
140.0000 mg/kg | Freq: Once | RESPIRATORY_TRACT | Status: AC
  Administered 2013-02-06: 15960 mg via ORAL
  Filled 2013-02-06: qty 90

## 2013-02-06 MED ORDER — ONDANSETRON HCL 4 MG/2ML IJ SOLN: 4.0000 mg | Freq: Four times a day (QID) | INTRAMUSCULAR | Status: DC | PRN

## 2013-02-06 MED ORDER — SENNOSIDES-DOCUSATE SODIUM 8.6-50 MG PO TABS
1.0000 | ORAL_TABLET | Freq: Two times a day (BID) | ORAL | Status: DC
  Administered 2013-02-06: 1 via ORAL
  Filled 2013-02-06 (×4): qty 1

## 2013-02-06 MED ORDER — OXYCODONE HCL 5 MG PO TABS
5.0000 mg | ORAL_TABLET | ORAL | Status: DC | PRN
  Administered 2013-02-06 – 2013-02-07 (×6): 5 mg via ORAL
  Filled 2013-02-06 (×6): qty 1

## 2013-02-06 MED ORDER — ONDANSETRON HCL 4 MG/2ML IJ SOLN
4.0000 mg | Freq: Once | INTRAMUSCULAR | Status: AC
  Administered 2013-02-06: 4 mg via INTRAVENOUS
  Filled 2013-02-06: qty 2

## 2013-02-06 MED ORDER — ACETYLCYSTEINE 20 % IN SOLN
70.0000 mg/kg | Freq: Once | RESPIRATORY_TRACT | Status: AC
  Administered 2013-02-06: 7620 mg via ORAL
  Filled 2013-02-06: qty 40

## 2013-02-06 MED ORDER — MORPHINE SULFATE 4 MG/ML IJ SOLN
4.0000 mg | Freq: Once | INTRAMUSCULAR | Status: AC
  Administered 2013-02-06: 4 mg via INTRAVENOUS
  Filled 2013-02-06: qty 1

## 2013-02-06 NOTE — ED Notes (Signed)
Pt alert and oriented x4. Respirations even and unlabored, bilateral symmetrical rise and fall of chest. Skin warm and dry. In no acute distress. Denies needs.   

## 2013-02-06 NOTE — ED Notes (Signed)
Attempted to call report to floor, no answer on unit.

## 2013-02-06 NOTE — H&P (Signed)
History and Physical  Lawrence Pearson:[id number] DOB: 10/28/1971 DOA: 02/05/2013  Referring physician: Dr Marian Sorrow PCP: Gentry Fitz   Chief Complaint: Tylenol overdose  HPI:  42 year old male with PMH of uncontrolled diabetes, HTN, chronic back pain and hyperlipidemia was brought in EMS to emergency department after Tylenol overdose. Patient reported he took 40 tablets of Tylenol PM containing 500 mg of Tylenol over the last 12 hours. He took 20 tablets approximately in the last hour prior to presenting to ER. He denied this was a suicide attempt, he reports he has not slept in several days, and was taking 4 or 5 a time to help get to sleep. He reports he took an entire bottle of Advil p.m. yesterday without help with sleep. He reports he does not have insurance and so does not have a Dr. or can afford his medications. Patient admits that he knows he is not supposed to take more than the prescribed dose of medication, and when he realized how may he took this evening he got concerned and called 911. No prior history of depression or suicide attempts. Patient was started on acytylcysteine and was given activated charcoal in the ER. Patient's ALT is not elevated yet but his acetaminophen level was found to be above the normogram value in the ER. He complains of abdominal pain which started after ingestion of acetylcysteine. Pain is 6/10 and present in the middle of his belly. The pain was relieved some after a BM he had just now. No nausea or vomiting noted.  Review of Systems:  No chest pain, shortness of breath, difficulty urination, appetite is fine, had a normal BM this am. No headache, no weakness, no skin rash noted. All other review of system is negative except what is noted above.  Past Medical History  Diagnosis Date  . Diabetes mellitus   . Bladder cancer   . Hypertension   . Chronic back pain     Past Surgical History  Procedure Laterality Date  . Bladder surgery      bladder cancer  surgery  . Thumb surgery      Social History:  reports that he has been smoking Cigarettes.  He has been smoking about 0.00 packs per day for the past 5 years. He has never used smokeless tobacco. He reports that  drinks alcohol. He reports that he uses illicit drugs (Marijuana).  Allergies  Allergen Reactions  . Penicillins Hives    Family History  Problem Relation Age of Onset  . Coronary artery disease Father      Prior to Admission medications   Medication Sig Start Date End Date Taking? Authorizing Provider  HYDROcodone-acetaminophen (NORCO) 10-325 MG per tablet Take 1-2 tablets by mouth every 6 (six) hours as needed for pain.   Yes Historical Provider, MD   Physical Exam: Filed Vitals:   02/06/13 8119 02/06/13 0700 02/06/13 0715 02/06/13 0734  BP: 132/93 139/92  117/75  Pulse: 92  87 86  Temp: 98.1 F (36.7 C)   98 F (36.7 C)  TempSrc: Oral     Resp: 16  16 20   Height:      Weight:      SpO2: 97%  97% 97%   Physical Exam: General: Vital signs reviewed and noted. Well-developed, well-nourished, in no acute distress; alert, appropriate and cooperative throughout examination.  Head: Normocephalic, atraumatic.  Eyes: PERRL, EOMI, No signs of anemia or jaundince.  Nose: Mucous membranes moist, not inflammed, nonerythematous.  Throat: Oropharynx nonerythematous, no exudate  appreciated.   Neck: No deformities, masses, or tenderness noted.Supple, No carotid Bruits, no JVD.  Lungs:  Normal respiratory effort. Clear to auscultation BL without crackles or wheezes.  Heart: RRR. S1 and S2 normal without gallop, murmur, or rubs.  Abdomen:  BS normoactive. Soft, distended due to fat, mild tenderness in the umbilical region and right upper quadrant.  No masses or organomegaly.  Extremities: No pretibial edema.  Neurologic: A&O X3, CN II - XII are grossly intact. Motor strength is 5/5 in the all 4 extremities, Sensations intact to light touch, Cerebellar signs negative.  Skin: No  visible rashes, scars.     Labs on Admission:  Basic Metabolic Panel:  Recent Labs Lab 02/05/13 2335  NA 136  K 3.4*  CL 100  CO2 25  GLUCOSE 259*  BUN 7  CREATININE 0.59  CALCIUM 8.6    Liver Function Tests:  Recent Labs Lab 02/05/13 2335  AST 11  ALT 17  ALKPHOS 61  BILITOT 0.2*  PROT 6.2  ALBUMIN 2.9*    CBC:  Recent Labs Lab 02/05/13 2335  WBC 7.8  HGB 13.3  HCT 40.8  MCV 85.5  PLT 305     EKG: Independently reviewed. 91 bpm, normal axis, no ST or T  wave changes. Normal EKG   Principal Problem:   Acetaminophen toxicity Active Problems:   Dyslipidemia   Uncontrolled diabetes mellitus   Assessment/Plan  Patient will be admitted to telemetry bed at this time. I will continue acetylcysteine until the value of acetaminophen level in blood is below 10.  -Cycle CMP -Cycle acetaminophen levels q12h -continue acetylcystiene per pharmacy q4h until toxicity resolved -Counselling for adequate management of insomnia and SW consult to find a PCP -SSI for diabetes -Start regular diet -Continue IVF@100cc /hr for now  Code Status: Full Family Communication: None present at bedside, patient updated Disposition Plan/Anticipated LOS: home when stable  Time spent: 70 minutes  Lars Mage, MD  [facility] Team 7 [facility]  If 7PM-7AM, please contact night-coverage at [email or web address], password [facility] 02/06/2013, 8:21 AM

## 2013-02-06 NOTE — ED Provider Notes (Signed)
History     CSN: [id number]  Arrival date & time 02/05/13  2323   First MD Initiated Contact with Patient 02/05/13 2326      Chief Complaint  Patient presents with  . Ingestion    (Consider location/radiation/quality/duration/timing/severity/associated sxs/prior treatment) HPI 42 year old male presents to emergency apartment after Tylenol overdose. Patient reports he is taking 40 tablets of Tylenol PM containing 500 mg of Tylenol over the last 12 hours. He is taken 20 tablets approximately in the last hour. He denies this was a suicide attempt, he reports he has not slept in several days, and was taking 4 or 5 a time to help get to sleep. He reports he took an entire bottle of Advil p.m. yesterday without help with sleep. Patient with history of diabetes, chronic back pain hypertension. He reports he does not have insurance and so does not have a Dr. or can afford his medications. Patient admits that he knows he is not supposed to take more than the prescribed dose of medication, and when he realized how may he took this evening he got concerned and called 911. No prior history of depression or suicide attempts. Past Medical History  Diagnosis Date  . Diabetes mellitus   . Bladder cancer   . Hypertension   . Chronic back pain     Past Surgical History  Procedure Laterality Date  . Bladder surgery      bladder cancer surgery  . Thumb surgery      Family History  Problem Relation Age of Onset  . Coronary artery disease Father     History  Substance Use Topics  . Smoking status: Current Every Day Smoker -- 5 years    Types: Cigarettes  . Smokeless tobacco: Never Used  . Alcohol Use: Yes      Review of Systems  All other systems reviewed and are negative.    Allergies  Penicillins  Home Medications   Current Outpatient Rx  Name  Route  Sig  Dispense  Refill  . HYDROcodone-acetaminophen (NORCO) 10-325 MG per tablet   Oral   Take 1-2 tablets by mouth every 6  (six) hours as needed for pain.           BP 137/92  Pulse 78  Temp(Src) 98.1 F (36.7 C) (Oral)  Resp 14  Ht 5\' 6"  (1.676 m)  Wt 240 lb (108.863 kg)  BMI 38.76 kg/m2  SpO2 95%  Physical Exam  Nursing note and vitals reviewed. Constitutional: He is oriented to person, place, and time. He appears well-developed and well-nourished.  HENT:  Head: Normocephalic and atraumatic.  Nose: Nose normal.  Dry mucous membranes  Eyes: Conjunctivae and EOM are normal. Pupils are equal, round, and reactive to light.  Neck: Normal range of motion. Neck supple. No JVD present. No tracheal deviation present. No thyromegaly present.  Cardiovascular: Normal rate, regular rhythm, normal heart sounds and intact distal pulses.  Exam reveals no gallop and no friction rub.   No murmur heard. Pulmonary/Chest: Effort normal and breath sounds normal. No stridor. No respiratory distress. He has no wheezes. He has no rales. He exhibits no tenderness.  Abdominal: Soft. Bowel sounds are normal. He exhibits no distension and no mass. There is no tenderness. There is no rebound and no guarding.  Musculoskeletal: Normal range of motion. He exhibits no edema and no tenderness.  Lymphadenopathy:    He has no cervical adenopathy.  Neurological: He is alert and oriented to person, place, and time.  He exhibits normal muscle tone. Coordination normal.  Skin: Skin is warm and dry. No rash noted. No erythema. No pallor.  Psychiatric: He has a normal mood and affect. His behavior is normal. Judgment and thought content normal.    ED Course  Procedures (including critical care time)  CRITICAL CARE Performed by: Olivia Mackie   Total critical care time: 60 min  Critical care time was exclusive of separately billable procedures and treating other patients.  Critical care was necessary to treat or prevent imminent or life-threatening deterioration.  Critical care was time spent personally by me on the following  activities: development of treatment plan with patient and/or surrogate as well as nursing, discussions with consultants, evaluation of patient's response to treatment, examination of patient, obtaining history from patient or surrogate, ordering and performing treatments and interventions, ordering and review of laboratory studies, ordering and review of radiographic studies, pulse oximetry and re-evaluation of patient's condition.   Labs Reviewed  COMPREHENSIVE METABOLIC PANEL - Abnormal; Notable for the following:    Potassium 3.4 (*)    Glucose, Bld 259 (*)    Albumin 2.9 (*)    Total Bilirubin 0.2 (*)    All other components within normal limits  ACETAMINOPHEN LEVEL - Abnormal; Notable for the following:    Acetaminophen (Tylenol), Serum 43.9 (*)    All other components within normal limits  SALICYLATE LEVEL - Abnormal; Notable for the following:    Salicylate Lvl <2.0 (*)    All other components within normal limits  URINE RAPID DRUG SCREEN (HOSP PERFORMED) - Abnormal; Notable for the following:    Tetrahydrocannabinol POSITIVE (*)    All other components within normal limits  CBC  ETHANOL  PROTIME-INR  ACETAMINOPHEN LEVEL   No results found.  Date: 02/06/2013  Rate: 91  Rhythm: normal sinus rhythm  QRS Axis: normal  Intervals: normal  ST/T Wave abnormalities: normal  Conduction Disutrbances:none  Narrative Interpretation:   Old EKG Reviewed: unchanged    1. Tylenol overdose   2. Diphenhydramine overdose   3. Chronic back pain       MDM  42 year old male with Tylenol/Benadryl overdose. Ingestion with the last hour, patient given charcoal and sorbitol. Poison control recommends loading dose of Mucomyst. We'll also check for level of Tylenol as well. Will get normal overdose workup. Patient does not have any symptoms of Benadryl toxicity at this time. He is adamant this was not a suicide attempt. May require admission for observation to complete the Mucomyst  dosing.  5:15 AM 4 hour Tylenol level negative. Poison control center recommending dosing for the next 24 hours. We'll discuss with hospitalist for admission. Patient complaining of his chronic back pain and wishing something for that.       Olivia Mackie, MD 02/06/13 [phone]

## 2013-02-07 LAB — GLUCOSE, CAPILLARY
Glucose-Capillary: 204 mg/dL — ABNORMAL HIGH (ref 70–99)
Glucose-Capillary: 250 mg/dL — ABNORMAL HIGH (ref 70–99)

## 2013-02-07 LAB — COMPREHENSIVE METABOLIC PANEL
Albumin: 3 g/dL — ABNORMAL LOW (ref 3.5–5.2)
BUN: 6 mg/dL (ref 6–23)
Calcium: 8.9 mg/dL (ref 8.4–10.5)
Chloride: 101 mEq/L (ref 96–112)
Creatinine, Ser: 0.67 mg/dL (ref 0.50–1.35)
Total Bilirubin: 0.1 mg/dL — ABNORMAL LOW (ref 0.3–1.2)

## 2013-02-07 LAB — ACETAMINOPHEN LEVEL: Acetaminophen (Tylenol), Serum: <15 ug/mL (ref 10–30)

## 2013-02-07 MED ORDER — ZOLPIDEM TARTRATE 10 MG PO TABS: 10.0000 mg | ORAL_TABLET | Freq: Every evening | ORAL | Status: DC | PRN

## 2013-02-07 MED ORDER — METFORMIN HCL 500 MG PO TABS
500.0000 mg | ORAL_TABLET | Freq: Two times a day (BID) | ORAL | Status: DC
  Filled 2013-02-07 (×2): qty 1

## 2013-02-07 MED ORDER — METFORMIN HCL 500 MG PO TABS: 500.0000 mg | ORAL_TABLET | Freq: Two times a day (BID) | ORAL | Status: DC

## 2013-02-07 MED ORDER — OXYCODONE HCL 5 MG PO TABS: 5.0000 mg | ORAL_TABLET | ORAL | Status: DC | PRN

## 2013-02-07 NOTE — Progress Notes (Signed)
He ambulates without difficulty with his son to d/c.  He signs for receipt of d/c instructions and prescriptions.

## 2013-02-07 NOTE — Discharge Summary (Signed)
Physician Discharge Summary  Lawrence Pearson ZOX:[id number] DOB: [birth year] DOA: 02/05/2013  PCP: Daisy Floro, MD  Admit date: 02/05/2013 Discharge date: 02/07/2013  Time spent: 35 minutes  Recommendations for Outpatient Follow-up:  1. CMP re LFTs in 2-3 weeks 2. HgA1C 2-3 months  Discharge Diagnoses:  Principal Problem:   Acetaminophen toxicity Active Problems:   Dyslipidemia   Uncontrolled diabetes mellitus   Discharge Condition: improved  Diet recommendation: cardiac/diabetic  Filed Weights   02/06/13 0016 02/06/13 0021  Weight: 114 kg (251 lb 5.2 oz) 108.863 kg (240 lb)    History of present illness:  42 year old male with PMH of uncontrolled diabetes, HTN, chronic back pain and hyperlipidemia was brought in EMS to emergency department after Tylenol overdose. Patient reported he took 40 tablets of Tylenol PM containing 500 mg of Tylenol over the last 12 hours. He took 20 tablets approximately in the last hour prior to presenting to ER. He denied this was a suicide attempt, he reports he has not slept in several days, and was taking 4 or 5 a time to help get to sleep. He reports he took an entire bottle of Advil p.m. yesterday without help with sleep. He reports he does not have insurance and so does not have a Dr. or can afford his medications. Patient admits that he knows he is not supposed to take more than the prescribed dose of medication, and when he realized how may he took this evening he got concerned and called 911. No prior history of depression or suicide attempts. Patient was started on acytylcysteine and was given activated charcoal in the ER. Patient's ALT is not elevated yet but his acetaminophen level was found to be above the normogram value in the ER.  He complains of abdominal pain which started after ingestion of acetylcysteine. Pain is 6/10 and present in the middle of his belly. The pain was relieved some after a BM he had just now. No nausea or vomiting  noted   Hospital Course:  Unintentional tylenol OD- acetylcysteine until levels < 15- LFTs monitored with no increase, avoid tylenol products Uncontrolled DM- start diabetic diet and metformin- most likely will need other agents started by PCP  Procedures:  none  Consultations:  none  Discharge Exam: Filed Vitals:   02/06/13 0805 02/06/13 0907 02/06/13 2200 02/07/13 0515  BP:  143/91 133/89 122/79  Pulse: 81 74 82 71  Temp:  98.3 F (36.8 C) 98 F (36.7 C) 97.8 F (36.6 C)  TempSrc:  Oral Oral Oral  Resp: 16 18 20 16   Height:      Weight:      SpO2: 97% 96% 95% 98%    General: a+Ox3, NAd Cardiovascular: rrr Respiratory: clear anterior  Discharge Instructions  Discharge Orders   Future Orders Complete By Expires     Diet - low sodium heart healthy  As directed     Diet Carb Modified  As directed     Discharge instructions  As directed     Comments:      Avoid tylenol/alcohol and any products toxic to your liver CMP 1 week re LFTs    Increase activity slowly  As directed         Medication List    STOP taking these medications       HYDROcodone-acetaminophen 10-325 MG per tablet  Commonly known as:  NORCO      TAKE these medications       metFORMIN 500 MG tablet  Commonly  known as:  GLUCOPHAGE  Take 1 tablet (500 mg total) by mouth 2 (two) times daily with a meal.     oxyCODONE 5 MG immediate release tablet  Commonly known as:  Oxy IR/ROXICODONE  Take 1 tablet (5 mg total) by mouth every 4 (four) hours as needed.     zolpidem 10 MG tablet  Commonly known as:  AMBIEN  Take 1 tablet (10 mg total) by mouth at bedtime as needed for sleep.           Follow-up Information   Follow up with call walk into the [facility] urgent care center from 10-5 M-F.       The results of significant diagnostics from this hospitalization (including imaging, microbiology, ancillary and laboratory) are listed below for reference.    Significant Diagnostic  Studies: No results found.  Microbiology: No results found for this or any previous visit (from the past 240 hour(s)).   Labs: Basic Metabolic Panel:  Recent Labs Lab 02/05/13 2335 02/06/13 0926 02/07/13 0342  NA 136 134* 136  K 3.4* 3.6 3.5  CL 100 102 101  CO2 25 22 26   GLUCOSE 259* 211* 141*  BUN 7 5* 6  CREATININE 0.59 0.44* 0.67  CALCIUM 8.6 8.5 8.9   Liver Function Tests:  Recent Labs Lab 02/05/13 2335 02/06/13 0926 02/07/13 0342  AST 11 12 12   ALT 17 17 15   ALKPHOS 61 56 57  BILITOT 0.2* 0.1* 0.1*  PROT 6.2 6.2 6.1  ALBUMIN 2.9* 2.9* 3.0*   No results found for this basename: LIPASE, AMYLASE,  in the last 168 hours No results found for this basename: AMMONIA,  in the last 168 hours CBC:  Recent Labs Lab 02/05/13 2335 02/06/13 0926  WBC 7.8 8.7  NEUTROABS  --  5.0  HGB 13.3 12.9*  HCT 40.8 40.2  MCV 85.5 85.7  PLT 305 310   Cardiac Enzymes: No results found for this basename: CKTOTAL, CKMB, CKMBINDEX, TROPONINI,  in the last 168 hours BNP: BNP (last 3 results) No results found for this basename: PROBNP,  in the last 8760 hours CBG:  Recent Labs Lab 02/06/13 1242 02/06/13 1639 02/06/13 2242 02/07/13 0729 02/07/13 1115  GLUCAP 198* 211* 204* 233* 250*       Signed:  Benjamine Mola, Nieshia Larmon  [facility] 02/07/2013, 1:33 PM

## 2013-03-20 ENCOUNTER — Emergency Department (HOSPITAL_COMMUNITY): Payer: Self-pay

## 2013-03-20 ENCOUNTER — Encounter (HOSPITAL_COMMUNITY): Payer: Self-pay | Admitting: Emergency Medicine

## 2013-03-20 ENCOUNTER — Observation Stay (HOSPITAL_COMMUNITY)
Admission: EM | Admit: 2013-03-20 | Discharge: 2013-03-21 | Disposition: A | Discharge status: Home / Self Care | Payer: Self-pay | Attending: Internal Medicine | Admitting: Internal Medicine

## 2013-03-20 DIAGNOSIS — IMO0001 Reserved for inherently not codable concepts without codable children: Secondary | ICD-10-CM | POA: Insufficient documentation

## 2013-03-20 DIAGNOSIS — IMO0002 Reserved for concepts with insufficient information to code with codable children: Secondary | ICD-10-CM

## 2013-03-20 DIAGNOSIS — I10 Essential (primary) hypertension: Secondary | ICD-10-CM | POA: Insufficient documentation

## 2013-03-20 DIAGNOSIS — M549 Dorsalgia, unspecified: Secondary | ICD-10-CM | POA: Insufficient documentation

## 2013-03-20 DIAGNOSIS — R739 Hyperglycemia, unspecified: Secondary | ICD-10-CM

## 2013-03-20 DIAGNOSIS — R109 Unspecified abdominal pain: Secondary | ICD-10-CM

## 2013-03-20 DIAGNOSIS — Z91199 Patient's noncompliance with other medical treatment and regimen due to unspecified reason: Secondary | ICD-10-CM | POA: Insufficient documentation

## 2013-03-20 DIAGNOSIS — R079 Chest pain, unspecified: Principal | ICD-10-CM

## 2013-03-20 DIAGNOSIS — Z9114 Patient's other noncompliance with medication regimen: Secondary | ICD-10-CM

## 2013-03-20 DIAGNOSIS — R0989 Other specified symptoms and signs involving the circulatory and respiratory systems: Secondary | ICD-10-CM | POA: Insufficient documentation

## 2013-03-20 DIAGNOSIS — R0609 Other forms of dyspnea: Secondary | ICD-10-CM | POA: Insufficient documentation

## 2013-03-20 DIAGNOSIS — Z8249 Family history of ischemic heart disease and other diseases of the circulatory system: Secondary | ICD-10-CM | POA: Insufficient documentation

## 2013-03-20 DIAGNOSIS — F17200 Nicotine dependence, unspecified, uncomplicated: Secondary | ICD-10-CM | POA: Insufficient documentation

## 2013-03-20 DIAGNOSIS — Z8551 Personal history of malignant neoplasm of bladder: Secondary | ICD-10-CM | POA: Insufficient documentation

## 2013-03-20 DIAGNOSIS — E1165 Type 2 diabetes mellitus with hyperglycemia: Secondary | ICD-10-CM

## 2013-03-20 DIAGNOSIS — G8929 Other chronic pain: Secondary | ICD-10-CM

## 2013-03-20 DIAGNOSIS — E119 Type 2 diabetes mellitus without complications: Secondary | ICD-10-CM

## 2013-03-20 DIAGNOSIS — E785 Hyperlipidemia, unspecified: Secondary | ICD-10-CM

## 2013-03-20 DIAGNOSIS — Z91148 Patient's other noncompliance with medication regimen for other reason: Secondary | ICD-10-CM

## 2013-03-20 DIAGNOSIS — I2510 Atherosclerotic heart disease of native coronary artery without angina pectoris: Secondary | ICD-10-CM

## 2013-03-20 DIAGNOSIS — Z9119 Patient's noncompliance with other medical treatment and regimen: Secondary | ICD-10-CM | POA: Insufficient documentation

## 2013-03-20 LAB — COMPREHENSIVE METABOLIC PANEL
ALT: 17 U/L (ref 0–53)
AST: 16 U/L (ref 0–37)
CO2: 22 mEq/L (ref 19–32)
Calcium: 9 mg/dL (ref 8.4–10.5)
Potassium: 3.5 mEq/L (ref 3.5–5.1)
Sodium: 130 mEq/L — ABNORMAL LOW (ref 135–145)
Total Protein: 6.4 g/dL (ref 6.0–8.3)

## 2013-03-20 LAB — URINALYSIS, ROUTINE W REFLEX MICROSCOPIC
Glucose, UA: >1000 mg/dL — AB
Leukocytes, UA: NEGATIVE
Protein, ur: NEGATIVE mg/dL
Specific Gravity, Urine: 1.005 (ref 1.005–1.030)
Urobilinogen, UA: 0.2 mg/dL (ref 0.0–1.0)

## 2013-03-20 LAB — CBC WITH DIFFERENTIAL/PLATELET
Basophils Relative: 1 % (ref 0–1)
HCT: 40.7 % (ref 39.0–52.0)
Hemoglobin: 14.1 g/dL (ref 13.0–17.0)
Lymphocytes Relative: 37 % (ref 12–46)
MCHC: 34.6 g/dL (ref 30.0–36.0)
Monocytes Relative: 7 % (ref 3–12)
Neutro Abs: 4.6 10*3/uL (ref 1.7–7.7)
Neutrophils Relative %: 55 % (ref 43–77)
RBC: 4.95 MIL/uL (ref 4.22–5.81)
WBC: 8.5 10*3/uL (ref 4.0–10.5)

## 2013-03-20 LAB — URINE MICROSCOPIC-ADD ON

## 2013-03-20 LAB — POCT I-STAT TROPONIN I: Troponin i, poc: 0.01 ng/mL (ref 0.00–0.08)

## 2013-03-20 MED ORDER — IBUPROFEN 600 MG PO TABS
600.0000 mg | ORAL_TABLET | Freq: Four times a day (QID) | ORAL | Status: DC | PRN
  Administered 2013-03-21 (×2): 600 mg via ORAL
  Filled 2013-03-20 (×2): qty 1

## 2013-03-20 MED ORDER — ENOXAPARIN SODIUM 40 MG/0.4ML ~~LOC~~ SOLN
40.0000 mg | Freq: Every day | SUBCUTANEOUS | Status: DC
  Administered 2013-03-21: 40 mg via SUBCUTANEOUS
  Filled 2013-03-20: qty 0.4

## 2013-03-20 MED ORDER — INSULIN ASPART 100 UNIT/ML ~~LOC~~ SOLN
0.0000 [IU] | Freq: Every day | SUBCUTANEOUS | Status: DC
  Administered 2013-03-21: 2 [IU] via SUBCUTANEOUS

## 2013-03-20 MED ORDER — ASPIRIN EC 81 MG PO TBEC
81.0000 mg | DELAYED_RELEASE_TABLET | Freq: Every day | ORAL | Status: DC
  Administered 2013-03-21: 81 mg via ORAL
  Filled 2013-03-20: qty 1

## 2013-03-20 MED ORDER — ATORVASTATIN CALCIUM 10 MG PO TABS
10.0000 mg | ORAL_TABLET | Freq: Every day | ORAL | Status: DC
  Administered 2013-03-21: 10 mg via ORAL
  Filled 2013-03-20: qty 1

## 2013-03-20 MED ORDER — SODIUM CHLORIDE 0.9 % IJ SOLN: 3.0000 mL | INTRAMUSCULAR | Status: DC | PRN

## 2013-03-20 MED ORDER — SODIUM CHLORIDE 0.9 % IV BOLUS (SEPSIS): 2000.0000 mL | Freq: Once | INTRAVENOUS | Status: DC

## 2013-03-20 MED ORDER — INSULIN ASPART 100 UNIT/ML ~~LOC~~ SOLN
0.0000 [IU] | Freq: Three times a day (TID) | SUBCUTANEOUS | Status: DC
  Administered 2013-03-21 (×2): 5 [IU] via SUBCUTANEOUS

## 2013-03-20 MED ORDER — SODIUM CHLORIDE 0.9 % IJ SOLN: 3.0000 mL | Freq: Two times a day (BID) | INTRAMUSCULAR | Status: DC

## 2013-03-20 MED ORDER — INSULIN GLARGINE 100 UNIT/ML ~~LOC~~ SOLN
20.0000 [IU] | Freq: Every day | SUBCUTANEOUS | Status: DC
  Administered 2013-03-21: 20 [IU] via SUBCUTANEOUS
  Filled 2013-03-20 (×2): qty 0.2

## 2013-03-20 MED ORDER — SODIUM CHLORIDE 0.9 % IV SOLN: 250.0000 mL | INTRAVENOUS | Status: DC | PRN

## 2013-03-20 MED ORDER — OXYCODONE-ACETAMINOPHEN 5-325 MG PO TABS
2.0000 | ORAL_TABLET | Freq: Once | ORAL | Status: AC
  Administered 2013-03-20: 2 via ORAL
  Filled 2013-03-20: qty 2

## 2013-03-20 MED ORDER — FAMOTIDINE IN NACL 20-0.9 MG/50ML-% IV SOLN
20.0000 mg | Freq: Once | INTRAVENOUS | Status: AC
  Administered 2013-03-20: 20 mg via INTRAVENOUS
  Filled 2013-03-20: qty 50

## 2013-03-20 MED ORDER — ONDANSETRON HCL 4 MG/2ML IJ SOLN
4.0000 mg | Freq: Once | INTRAMUSCULAR | Status: AC
  Administered 2013-03-20: 4 mg via INTRAVENOUS
  Filled 2013-03-20: qty 2

## 2013-03-20 MED ORDER — SODIUM CHLORIDE 0.9 % IV BOLUS (SEPSIS)
1000.0000 mL | Freq: Once | INTRAVENOUS | Status: AC
  Administered 2013-03-20: 1000 mL via INTRAVENOUS

## 2013-03-20 NOTE — ED Provider Notes (Signed)
History     CSN: [id number]  Arrival date & time 03/20/13  1747   First MD Initiated Contact with Patient 03/20/13 1748      Chief Complaint  Patient presents with  . Chest Pain    (Consider location/radiation/quality/duration/timing/severity/associated sxs/prior treatment) HPI Comments: Patient is a 42 y/o M with PMHx for type 2 DM, bladder cancer s/p surgery, HTN, chronic back pain, chronic abdominal pain, presenting to the ED with chest pain that began approximately 45 minutes ago. Patient reported that while sitting at rest chest pain began on the left axillary region and began to spread to the left side of the chest to mid-sternum with radiation to the back - no radiation to the left arm or right side of the chest. Patient described the pain to initially be of a pressure, patient stated that it felt like "someone knocked the wind out of me" - with mild shortness of breathe and difficulty breathing - pain shortly after was described as intermittent pulsating pain that lasted a couple of minutes, but now has become constant. Patient reported feeling "clammy" upon onset of symptoms. Patient stated that EMS was called - was given ASA 324 mg and 2 nitroglycerin en route to hospital - patient reported moderate relief to chest pain. Associated symptoms are nausea, increased abdominal pain to chronic problem, mainly to lower abdomen. Denied vomiting, fever, chills, photophobia, visual distortions, neck pain, dysphagia, odynphagia, diarrhea, constipation, urinary symptoms, paresthesias to extremities, weakness, confusion, altered mental status.   Upon ED arrival, EMS reported CBG to be 489 - patient reported that he rarely checks his blood glucose levels and does not take his medications for his diabetes due to not being able to afford seeing a physician or the medical necessities to monitor blood glucose levels. Patient reported that it has been months since he has checked blood glucose or taken  medications for diabetes.  Patient does not have a PCP  Reviewed patient's chart - patient had episode of chest pain back in January 2013, shortly after catheterization performed where mild diffuse small vessel disease was noted - no stent placed.   The history is provided by the patient. No language interpreter was used.    Past Medical History  Diagnosis Date  . Diabetes mellitus   . Bladder cancer   . Hypertension   . Chronic back pain     Past Surgical History  Procedure Laterality Date  . Bladder surgery      bladder cancer surgery  . Thumb surgery      Family History  Problem Relation Age of Onset  . Coronary artery disease Father     History  Substance Use Topics  . Smoking status: Current Every Day Smoker -- 5 years    Types: Cigarettes  . Smokeless tobacco: Never Used  . Alcohol Use: Yes      Review of Systems  Constitutional: Negative for fever and chills.  HENT: Negative for ear pain, sore throat, trouble swallowing and neck pain.   Eyes: Negative for photophobia, pain and visual disturbance.  Respiratory: Positive for chest tightness and shortness of breath.   Cardiovascular: Positive for chest pain. Negative for leg swelling.  Gastrointestinal: Positive for nausea and abdominal pain. Negative for vomiting, diarrhea and constipation.  Genitourinary: Negative for decreased urine volume and difficulty urinating.  Neurological: Negative for dizziness, weakness, light-headedness and headaches.  Psychiatric/Behavioral: Negative for confusion.  All other systems reviewed and are negative.    Allergies  Penicillins  Home  Medications   Current Outpatient Rx  Name  Route  Sig  Dispense  Refill  . ibuprofen (ADVIL,MOTRIN) 200 MG tablet   Oral   Take 400-800 mg by mouth every 6 (six) hours as needed for pain. For pain         . metFORMIN (GLUCOPHAGE) 500 MG tablet   Oral   Take 1 tablet (500 mg total) by mouth 2 (two) times daily with a meal.   60  tablet   0     BP 105/51  Pulse 84  Temp(Src) 98.5 F (36.9 C) (Oral)  Resp 19  SpO2 96%  Physical Exam  Nursing note and vitals reviewed. Constitutional: He is oriented to person, place, and time. He appears well-developed and well-nourished. No distress.  HENT:  Head: Normocephalic and atraumatic.  Eyes: Conjunctivae and EOM are normal. Pupils are equal, round, and reactive to light. Right eye exhibits no discharge. Left eye exhibits no discharge.  Neck: Normal range of motion. Neck supple. No tracheal deviation present. No thyromegaly present.  Negative lymphadenopathy  Cardiovascular: Regular rhythm, normal heart sounds and intact distal pulses.  Exam reveals no friction rub.   No murmur heard. Mild tachycardia  Pulmonary/Chest: Effort normal and breath sounds normal. No respiratory distress. He has no wheezes. He has no rales.  Abdominal: Soft. Bowel sounds are normal. He exhibits no distension and no mass. There is tenderness. There is no rebound and no guarding.  Generalized tenderness to all 4 quadrants upon deep palpation.  Lymphadenopathy:    He has no cervical adenopathy.  Neurological: He is alert and oriented to person, place, and time. No cranial nerve deficit. He exhibits normal muscle tone. Coordination normal.  Skin: Skin is warm and dry. No rash noted. He is not diaphoretic. No erythema.  Psychiatric: He has a normal mood and affect. His behavior is normal. Thought content normal.    ED Course  Procedures (including critical care time)  Results for orders placed during the hospital encounter of 03/20/13  CBC WITH DIFFERENTIAL      Result Value Range   WBC 8.5  4.0 - 10.5 K/uL   RBC 4.95  4.22 - 5.81 MIL/uL   Hemoglobin 14.1  13.0 - 17.0 g/dL   HCT 16.1  09.6 - 04.5 %   MCV 82.2  78.0 - 100.0 fL   MCH 28.5  26.0 - 34.0 pg   MCHC 34.6  30.0 - 36.0 g/dL   RDW 40.9  81.1 - 91.4 %   Platelets 287  150 - 400 K/uL   Neutrophils Relative 55  43 - 77 %    Neutro Abs 4.6  1.7 - 7.7 K/uL   Lymphocytes Relative 37  12 - 46 %   Lymphs Abs 3.1  0.7 - 4.0 K/uL   Monocytes Relative 7  3 - 12 %   Monocytes Absolute 0.6  0.1 - 1.0 K/uL   Eosinophils Relative 1  0 - 5 %   Eosinophils Absolute 0.1  0.0 - 0.7 K/uL   Basophils Relative 1  0 - 1 %   Basophils Absolute 0.1  0.0 - 0.1 K/uL  LIPASE, BLOOD      Result Value Range   Lipase 32  11 - 59 U/L  URINALYSIS, ROUTINE W REFLEX MICROSCOPIC      Result Value Range   Color, Urine YELLOW  YELLOW   APPearance CLEAR  CLEAR   Specific Gravity, Urine 1.005  1.005 - 1.030   pH  5.5  5.0 - 8.0   Glucose, UA >1000 (*) NEGATIVE mg/dL   Hgb urine dipstick NEGATIVE  NEGATIVE   Bilirubin Urine NEGATIVE  NEGATIVE   Ketones, ur NEGATIVE  NEGATIVE mg/dL   Protein, ur NEGATIVE  NEGATIVE mg/dL   Urobilinogen, UA 0.2  0.0 - 1.0 mg/dL   Nitrite NEGATIVE  NEGATIVE   Leukocytes, UA NEGATIVE  NEGATIVE  COMPREHENSIVE METABOLIC PANEL      Result Value Range   Sodium 130 (*) 135 - 145 mEq/L   Potassium 3.5  3.5 - 5.1 mEq/L   Chloride 98  96 - 112 mEq/L   CO2 22  19 - 32 mEq/L   Glucose, Bld 380 (*) 70 - 99 mg/dL   BUN 8  6 - 23 mg/dL   Creatinine, Ser 1.61  0.50 - 1.35 mg/dL   Calcium 9.0  8.4 - 09.6 mg/dL   Total Protein 6.4  6.0 - 8.3 g/dL   Albumin 3.2 (*) 3.5 - 5.2 g/dL   AST 16  0 - 37 U/L   ALT 17  0 - 53 U/L   Alkaline Phosphatase 87  39 - 117 U/L   Total Bilirubin 0.2 (*) 0.3 - 1.2 mg/dL   GFR calc non Af Amer >90  >90 mL/min   GFR calc Af Amer >90  >90 mL/min  URINE MICROSCOPIC-ADD ON      Result Value Range   Squamous Epithelial / LPF RARE  RARE   WBC, UA 0-2  <3 WBC/hpf   Urine-Other RARE YEAST    POCT I-STAT TROPONIN I      Result Value Range   Troponin i, poc 0.00  0.00 - 0.08 ng/mL   Comment 3            Dg Chest Port 1 View  03/20/2013  *RADIOLOGY REPORT*  Clinical Data: Left-sided chest pain for 1 hour.  Shortness of breath.  History of smoking, diabetes, hypertension.  No medications.   PORTABLE CHEST - 1 VIEW  Comparison: 01/01/2012  Findings: Heart size is accentuated by the portable technique. There are no focal consolidations or pleural effusions.  Mildly prominent interstitial markings appear likely chronic.  No evidence for pulmonary edema.  IMPRESSION: No evidence for acute cardiopulmonary abnormality.   Original Report Authenticated By: Norva Pavlov, M.D.     Date: 03/20/2013  Rate: 103  Rhythm: sinus tachycardia  QRS Axis: normal  Intervals: normal  ST/T Wave abnormalities: normal  Conduction Disutrbances:none  Narrative Interpretation:   Old EKG Reviewed: unchanged    1. Chest pain   2. Abdominal pain   3. Chronic abdominal pain   4. Hyperglycemia   5. Noncompliance with medications   6. Type 2 diabetes mellitus   7. Coronary artery disease       MDM  Patient is a 42 y/o M with PMHx of chronic back pain, bladder cancer s/p surgery, HTN, type 2 DM presenting to the ED with chest pain. Was given ASA 324 mg and 2 nitroglycerin by EMS while en route to hospital - reported moderate chest pain relief. Patient had episode of chest pain back on January 01, 2012, where he was then admitted by triad and left heart catheterization with coronoary angiogram performed on January 02, 2012 - small vessel disease noted.  Patient reported being non-compliant with diabetic medication and control of glucose. Patient has history of chronic back and abdominal pain.    Patient does not have a PCP I personally evaluated and examined  the patient. Discussed case with Dr. Fonnie Jarvis  DDx: NSTEMI/STEMI Angina  IV saline lock place. IV fluids given. Famotidine given for abdominal pain. Zofran given for nausea. Percocet given for back pain.   UA glucose elevated (>1000), negative findings Troponin negative (0.00) CBC negative findings  Lipase within normal limits (32) CMP sodium low (130), glucose elevated (380) Portable chest xray: no evidence for acute cardiopulmonary  abdnormality EKG no findings of STEMI/NSTEMI No anion gap - r/o DKA  Dr. Fonnie Jarvis spoke with physician for admission - patient is to be admitted to [facility]. On going Troponins ordered. Cardiology consult with [place] tonight.          Raymon Mutton, PA-C 03/20/13 2029

## 2013-03-20 NOTE — ED Notes (Signed)
Per EMS; pt states he has had chest pain before and has been told he has blockages; pt states CP started 45 minutes ago; CBG 489, pt on metformin but did not take medication today; VS BP 150/110 HR 110 NSR RR 18 97% on RA; hx diabetes and anxiety; pt smokes a pack a day

## 2013-03-20 NOTE — H&P (Signed)
Hospital Admission Note Date: 03/20/2013  Patient name: Lawrence Pearson Medical record number: [id number] Date of birth: August 14, 1971 Age: 42 y.o. Gender: male PCP: No PCP Per Patient  Medical Service: Internal Medicine Teaching Service   Attending physician: Dr. Rogelia Boga  1st Contact: Dr. Collier Bullock   Pager: [phone]  2nd Contact: Dr. Dorise Hiss   Pager: [phone]  After 5 pm or weekends:  1st Contact:     Pager: [phone]  2nd Contact:     Pager: [phone]    Chief Complaint:  Chest pain  History of Present Illness: 42 yo M with PMH DM II, bladder cancer s/p surgery, HTN, chronic back pain, chronic abdominal pain, and CAD, presents to the ED with chest pain that began approximately 45 minutes prior to coming to the ED. Pt reports that he was at rest when his chest pain began. It started in his left axilla and spread into his left chest/mid sternum with radiation into his back. He describes the pain as more of an intermittent pressure-type sensation that worsened and he became short of breath. The pain is now constant. EMS was called and gave the patient ASA 324mg  and 2 Nitroglycerin, which provided some pain relief. He also endorses nausea and worsening abdominal pain. He was seen in January '13 for chest pain. A heart catheterization was performed by Cardiology, which showed mild diffuse small vessel disease. Medical therapy was recommended including Aspirin, Lisinopril 2.5qd, Metoprolol 12.5 mg bid and Crestor 40 mg qhs; however, he has not been taking these due to financial constraints.  He denies fevers, chills, emesis, changes in vision, diarrhea, constipation, urinary symptoms, weakness, or confusion.  Per EMS, CBG was 480. Per EMS, pt is supposed to be on medication for his DM but rarely takes it b/c he cannot afford it. He reported to [place] that he does usually take the Metformin but could not afford the other medicines prescribed back in 12/2011. He also rarely checks his blood glucose levels.     Meds: Current Outpatient Rx  Name  Route  Sig  Dispense  Refill  . ibuprofen (ADVIL,MOTRIN) 200 MG tablet   Oral   Take 400-800 mg by mouth every 6 (six) hours as needed for pain. For pain         . metFORMIN (GLUCOPHAGE) 500 MG tablet   Oral   Take 1 tablet (500 mg total) by mouth 2 (two) times daily with a meal.   60 tablet   0     Allergies: Allergies as of 03/20/2013 - Review Complete 03/20/2013  Allergen Reaction Noted  . Penicillins Hives 01/01/2012   Past Medical History  Diagnosis Date  . Diabetes mellitus   . Bladder cancer   . Hypertension   . Chronic back pain    Past Surgical History  Procedure Laterality Date  . Bladder surgery      bladder cancer surgery  . Thumb surgery     Family History  Problem Relation Age of Onset  . Coronary artery disease Father    History   Social History  . Marital Status: Married    Spouse Name: N/A    Number of Children: N/A  . Years of Education: N/A   Occupational History  . Not on file.   Social History Main Topics  . Smoking status: Current Every Day Smoker -- 5 years    Types: Cigarettes  . Smokeless tobacco: Never Used  . Alcohol Use: Yes  . Drug Use: Yes    Special:  Marijuana  . Sexually Active: Not on file   Other Topics Concern  . Not on file   Social History Narrative  . No narrative on file    Review of Systems: A 10 point ROS was performed; pertinent positives and negatives were noted in the HPI   Physical Exam: Blood pressure 124/84, pulse 79, temperature 98.5 F (36.9 C), temperature source Oral, resp. rate 14, SpO2 94.00%. General: Alert, well-developed, and cooperative on examination.  Head: Normocephalic and atraumatic.  Eyes: Vision grossly intact, pupils equal, round, and reactive to light, no injection and anicteric.  Mouth: Poor dentition  Neck: Supple, full ROM Heart: Tachycardic, regular rhythm, no murmur, no gallop, and no rub.  Abdomen: Obese. Soft, mild  midepigastric tenderness to palpation, non-distended, normal bowel sounds, no guarding, no rebound tenderness, no organomegaly.  Msk: No joint swelling, warmth, or erythema.  Extremities: 2+ radial and DP pulses bilaterally. No cyanosis, clubbing, edema Neurologic: Alert & oriented X3, cranial nerves II-XII intact, strength normal in all extremities, sensation intact to light touch, and gait normal.  Skin: Turgor normal and no rashes.  Psych: Normal mood and affect   Lab results: Basic Metabolic Panel:  Recent Labs  16/10/96 1826  NA 130*  K 3.5  CL 98  CO2 22  GLUCOSE 380*  BUN 8  CREATININE 0.65  CALCIUM 9.0   Liver Function Tests:  Recent Labs  03/20/13 1826  AST 16  ALT 17  ALKPHOS 87  BILITOT 0.2*  PROT 6.4  ALBUMIN 3.2*    Recent Labs  03/20/13 1826  LIPASE 32   CBC:  Recent Labs  03/20/13 1826  WBC 8.5  NEUTROABS 4.6  HGB 14.1  HCT 40.7  MCV 82.2  PLT 287   Cardiac Enzymes: 03/20/13  Troponin i, poc @1841 : 0.00 @ 2113: 0.01  Urine Drug Screen: Drugs of Abuse     Component Value Date/Time   LABOPIA NONE DETECTED 02/06/2013 0058   COCAINSCRNUR NONE DETECTED 02/06/2013 0058   LABBENZ NONE DETECTED 02/06/2013 0058   AMPHETMU NONE DETECTED 02/06/2013 0058   THCU POSITIVE* 02/06/2013 0058   LABBARB NONE DETECTED 02/06/2013 0058    Urinalysis:  Recent Labs  03/20/13 1846  COLORURINE YELLOW  LABSPEC 1.005  PHURINE 5.5  GLUCOSEU >1000*  HGBUR NEGATIVE  BILIRUBINUR NEGATIVE  KETONESUR NEGATIVE  PROTEINUR NEGATIVE  UROBILINOGEN 0.2  NITRITE NEGATIVE  LEUKOCYTESUR NEGATIVE    Imaging results:  Dg Chest Port 1 View  03/20/2013  *RADIOLOGY REPORT*  Clinical Data: Left-sided chest pain for 1 hour.  Shortness of breath.  History of smoking, diabetes, hypertension.  No medications.  PORTABLE CHEST - 1 VIEW  Comparison: 01/01/2012  Findings: Heart size is accentuated by the portable technique. There are no focal consolidations or pleural  effusions.  Mildly prominent interstitial markings appear likely chronic.  No evidence for pulmonary edema.  IMPRESSION: No evidence for acute cardiopulmonary abnormality.   Original Report Authenticated By: Norva Pavlov, M.D.     Other results: EKG: Sinus tachycardia, rate 103. No ST changes.  Assessment & Plan by Problem: 42 yo M with PMH DM II, bladder cancer s/p surgery, HTN, chronic back pain, chronic abdominal pain, presents to the ED with chest pain that began approximately 45 minutes prior to coming to the ED.   1. Chest pain: Left axillary and midsternal pain radiating into his back. Pian subsided after ASA and Nitro. Troponin x1 negative and EKG without concerns for ischemia. Cardiology was consulted in the ED,  and given his unremarkable cardiac cath 12/2011 and no other chest pain until today, he recommended ASA 81mg  daily and Lipitor 10mg  qhs. For any further chest pain, Imdur 30mg  po daily was recommended. No need to repeat cath at this time. At this time, his symptoms are likely not 2/2 ACS. He does have a h/o of panic attacks, and states that this could have been from an attack, although he denies ever experiencing chest pain during one of his attacks. He also has a h/o reflux, but states that his typical reflux pain is different from his pain today. His pain is reproducible on palpation of his mid chest, although it is less intense than his pain from earlier today, so this could also be musculoskeletal in nature. Will admit for observation given h/o CAD. - Admit to IMTS to tele for observation - Troponin q6h x3 - Lipid panel - Heart healthy/Carb Modified diet - ASA 81mg  daily - Lipitor 10mg  daily - AM EKG - F/u with Cardiology, appreciate recommendations   2. DM II: CBG 489 per EMS. 380 on BMP in the ED. A1c from previous admission was 11.0. He is supposed to be on Metformin at home, questionable compliance- He denies taking it to the Cardiologist but told us the he has been  taking, with last dose last night. - Lantus 20u qhs  - SSI - CBG AC&HS - Diabetes coordinator consult - SW consult to provide community resource guide   3. Tobacco abuse: Smokes 1ppd x25ys.  - SW consult for cessation.  DVT PPx: Lovenox   Dispo: Disposition is deferred at this time, awaiting improvement of current medical problems. Anticipated discharge in approximately 1-2 day(s).   The patient does not have a current PCP (No PCP Per Patient), therefore will possibly be requiring OPC follow-up after discharge.   The patient does not have transportation limitations that hinder transportation to clinic appointments.  Signed: Genelle Gather 03/20/2013, 10:29 PM

## 2013-03-20 NOTE — ED Notes (Signed)
Cardiology states they will be treating pts chest pain with medication; Cardiology states internal medicine will come down and admit pt

## 2013-03-20 NOTE — ED Notes (Signed)
Report given to Jedd[place], Charity fundraiser. Nurse has no further questions upon report given; pt being transported to floor

## 2013-03-20 NOTE — ED Notes (Signed)
Spoke with internal medicine and they state they are on their way down now to see pt

## 2013-03-20 NOTE — ED Notes (Signed)
Cardiology at bedside.

## 2013-03-20 NOTE — ED Notes (Signed)
Marissa, PA at bedside. 

## 2013-03-20 NOTE — ED Notes (Signed)
Pt given 324 ASA en route and 2 nitro; pt c/o nausea with no vomiting episode today; pt states this has happened before and was told he has blockages

## 2013-03-20 NOTE — ED Notes (Signed)
Called flow manager to ensure pt would have be requested shortly

## 2013-03-20 NOTE — Consult Note (Signed)
Reason for Consult:Chest pain  Referring Physician: Dr. Wayland Salinas  Lawrence Pearson is an 42 y.o. male.   HPI: 42 y/o male currently seen in consultation with DrJohn Alberteen Sam for evaluation of chest pain. He was previously seen in January of 2013 for chest pain evaluation.  At that time, patient had a cardiac catheterization (by Dr. Jacinto Halim) which showed mild diffuse small vessel disease (mild luminal irregularities). Medical therapy was recommended: Aspirin, Lisinopril 2.5qd, Metoprolol 12.5 mg bid and Crestor 40 mg qhs. However, patient has not been taking any of the medication because he cannot afford them.  He has been lost to follow-up and has not been seen by cardiologist since then. He presents to the emergency room for evaluation of abdominal pain and complained of chest pain. His chest pain occurred earlier today at rest.  It was described as a sharp pain, 8/10 in severity, lasted a few minutes before spontaneous resolution.  It was associated with shortness of breath and nausea but denies any radiation, diaphoresis or palpitation.  In ED, he received ASA, SL NTG and he is currently chest pain free. EKG showed sinus tachycardia (103 bpm) with no ST or T-wave changes to suggest ischemia and his first set of cardiac markers are unremarkable.     CARDIAC CATH 01/02/2012  Author: Pamella Pert, MD Service: Cardiac Cath Author Type: Physician   Filed: 01/02/2012 3:10 PM Note Time: 01/02/2012 3:01 PM         01/01/2012 - 01/02/2012  3:01 PM  PATIENT: Lawrence Pearson 42 y.o. male  PRE-OPERATIVE DIAGNOSIS: chest pain  POST-OPERATIVE DIAGNOSIS: Mild diffuse small vessel disease. Mild luminal irregularities.  PROCEDURE: Procedure(s):  LEFT HEART CATHETERIZATION WITH CORONARY ANGIOGRAM  SURGEON: Surgeon(s):  Pamella Pert, MD  DICTATION: .Other Dictation: Dictation Number [id number]  D/W Dr. Roanna Epley. Will signoff needs primary prevention.      Past Medical History  Diagnosis Date  .  Diabetes mellitus   . Bladder cancer   . Hypertension   . Chronic back pain     Past Surgical History  Procedure Laterality Date  . Bladder surgery      bladder cancer surgery  . Thumb surgery      Family History  Problem Relation Age of Onset  . Coronary artery disease Father     Social History:  reports that he has been smoking Cigarettes.  He has been smoking about 0.00 packs per day for the past 5 years. He has never used smokeless tobacco. He reports that  drinks alcohol. He reports that he uses illicit drugs (Marijuana).  Allergies:  Allergies  Allergen Reactions  . Penicillins Hives    Medications:  Metformin 500 mg bid (Not taking)  Results for orders placed during the hospital encounter of 03/20/13 (from the past 48 hour(s))  CBC WITH DIFFERENTIAL     Status: None   Collection Time    03/20/13  6:26 PM      Result Value Range   WBC 8.5  4.0 - 10.5 K/uL   RBC 4.95  4.22 - 5.81 MIL/uL   Hemoglobin 14.1  13.0 - 17.0 g/dL   HCT 04.5  40.9 - 81.1 %   MCV 82.2  78.0 - 100.0 fL   MCH 28.5  26.0 - 34.0 pg   MCHC 34.6  30.0 - 36.0 g/dL   RDW 91.4  78.2 - 95.6 %   Platelets 287  150 - 400 K/uL   Neutrophils Relative 55  43 -  77 %   Neutro Abs 4.6  1.7 - 7.7 K/uL   Lymphocytes Relative 37  12 - 46 %   Lymphs Abs 3.1  0.7 - 4.0 K/uL   Monocytes Relative 7  3 - 12 %   Monocytes Absolute 0.6  0.1 - 1.0 K/uL   Eosinophils Relative 1  0 - 5 %   Eosinophils Absolute 0.1  0.0 - 0.7 K/uL   Basophils Relative 1  0 - 1 %   Basophils Absolute 0.1  0.0 - 0.1 K/uL  LIPASE, BLOOD     Status: None   Collection Time    03/20/13  6:26 PM      Result Value Range   Lipase 32  11 - 59 U/L  COMPREHENSIVE METABOLIC PANEL     Status: Abnormal   Collection Time    03/20/13  6:26 PM      Result Value Range   Sodium 130 (*) 135 - 145 mEq/L   Potassium 3.5  3.5 - 5.1 mEq/L   Chloride 98  96 - 112 mEq/L   CO2 22  19 - 32 mEq/L   Glucose, Bld 380 (*) 70 - 99 mg/dL   BUN 8  6 - 23  mg/dL   Creatinine, Ser 1.61  0.50 - 1.35 mg/dL   Calcium 9.0  8.4 - 09.6 mg/dL   Total Protein 6.4  6.0 - 8.3 g/dL   Albumin 3.2 (*) 3.5 - 5.2 g/dL   AST 16  0 - 37 U/L   ALT 17  0 - 53 U/L   Alkaline Phosphatase 87  39 - 117 U/L   Total Bilirubin 0.2 (*) 0.3 - 1.2 mg/dL   GFR calc non Af Amer >90  >90 mL/min   GFR calc Af Amer >90  >90 mL/min   Comment:            The eGFR has been calculated     using the CKD EPI equation.     This calculation has not been     validated in all clinical     situations.     eGFR's persistently     <90 mL/min signify     possible Chronic Kidney Disease.  POCT I-STAT TROPONIN I     Status: None   Collection Time    03/20/13  6:41 PM      Result Value Range   Troponin i, poc 0.00  0.00 - 0.08 ng/mL   Comment 3            Comment: Due to the release kinetics of cTnI,     a negative result within the first hours     of the onset of symptoms does not rule out     myocardial infarction with certainty.     If myocardial infarction is still suspected,     repeat the test at appropriate intervals.  URINALYSIS, ROUTINE W REFLEX MICROSCOPIC     Status: Abnormal   Collection Time    03/20/13  6:46 PM      Result Value Range   Color, Urine YELLOW  YELLOW   APPearance CLEAR  CLEAR   Specific Gravity, Urine 1.005  1.005 - 1.030   pH 5.5  5.0 - 8.0   Glucose, UA >1000 (*) NEGATIVE mg/dL   Hgb urine dipstick NEGATIVE  NEGATIVE   Bilirubin Urine NEGATIVE  NEGATIVE   Ketones, ur NEGATIVE  NEGATIVE mg/dL   Protein, ur NEGATIVE  NEGATIVE mg/dL  Urobilinogen, UA 0.2  0.0 - 1.0 mg/dL   Nitrite NEGATIVE  NEGATIVE   Leukocytes, UA NEGATIVE  NEGATIVE  URINE MICROSCOPIC-ADD ON     Status: None   Collection Time    03/20/13  6:46 PM      Result Value Range   Squamous Epithelial / LPF RARE  RARE   WBC, UA 0-2  <3 WBC/hpf   Urine-Other RARE YEAST      Dg Chest Port 1 View  03/20/2013  *RADIOLOGY REPORT*  Clinical Data: Left-sided chest pain for 1 hour.   Shortness of breath.  History of smoking, diabetes, hypertension.  No medications.  PORTABLE CHEST - 1 VIEW  Comparison: 01/01/2012  Findings: Heart size is accentuated by the portable technique. There are no focal consolidations or pleural effusions.  Mildly prominent interstitial markings appear likely chronic.  No evidence for pulmonary edema.  IMPRESSION: No evidence for acute cardiopulmonary abnormality.   Original Report Authenticated By: Norva Pavlov, M.D.     Review of Systems  Constitutional: Negative for fever, chills, weight loss, malaise/fatigue and diaphoresis.  HENT: Negative for hearing loss, ear pain, nosebleeds, congestion, neck pain, tinnitus and ear discharge.   Eyes: Negative for blurred vision, double vision, photophobia, pain, discharge and redness.  Respiratory: Negative for cough, hemoptysis, sputum production, shortness of breath and wheezing.   Cardiovascular: Positive for chest pain. Negative for palpitations, orthopnea, claudication and leg swelling.  Gastrointestinal: Positive for nausea and abdominal pain. Negative for heartburn, vomiting, diarrhea, constipation, blood in stool and melena.  Genitourinary: Negative for dysuria, urgency, frequency, hematuria and flank pain.  Musculoskeletal: Negative for myalgias, back pain, joint pain and falls.  Skin: Negative for itching and rash.  Neurological: Negative for dizziness, tingling, tremors, sensory change, speech change, focal weakness, seizures, loss of consciousness, weakness and headaches.  Endo/Heme/Allergies: Negative for environmental allergies and polydipsia. Does not bruise/bleed easily.  Psychiatric/Behavioral: Negative for depression, suicidal ideas, hallucinations and substance abuse. The patient is not nervous/anxious and does not have insomnia.    Blood pressure 105/51, pulse 84, temperature 98.5 F (36.9 C), temperature source Oral, resp. rate 19, SpO2 96.00%. Physical Exam  Constitutional: He is  oriented to person, place, and time. He appears well-developed and well-nourished. No distress.  HENT:  Head: Normocephalic and atraumatic.  Eyes: EOM are normal. Pupils are equal, round, and reactive to light. Right eye exhibits no discharge. Left eye exhibits no discharge. No scleral icterus.  Neck: No JVD present. No tracheal deviation present. No thyromegaly present.  Cardiovascular: Normal rate, regular rhythm and normal heart sounds.  Exam reveals no gallop and no friction rub.   No murmur heard. Respiratory: Effort normal and breath sounds normal. No stridor. No respiratory distress. He has no wheezes. He has no rales. He exhibits no tenderness.  GI: Soft. Bowel sounds are normal. He exhibits no distension.  Musculoskeletal: He exhibits no edema and no tenderness.  Lymphadenopathy:    He has no cervical adenopathy.  Neurological: He is alert and oriented to person, place, and time.  Skin: No rash noted. He is not diaphoretic. No erythema.  Psychiatric: He has a normal mood and affect.    Assessment/Plan:  1. Chest pain: Patient has a history of angiographically in-significant CAD by cardiac cath 01/02/2012, and he has not had any further chest pain until today despite the fact that he is not taking any of his medication.  His first set of cardiac marker is un-remarkable, and his EKG does not show any  evidence of ischemia.  I will recommend medical therapy with ASA 81 mg q day and Lipitor 10 mg qhs. If he has any further chest pain, he may benefit from Imdur 30 mg q day.  There is no indication for a repeat cardiac catheterization at this time.  2. DM: patient is currently on oral hypoglycemia agent.  3. Tobacco abuse: smoking cessation advised.  Aviella Disbrow E 03/20/2013, 8:26 PM

## 2013-03-20 NOTE — ED Notes (Signed)
Internal medicine at bedside

## 2013-03-20 NOTE — ED Provider Notes (Signed)
Medical screening examination/treatment/procedure(s) were conducted as a shared visit with non-physician practitioner(s) and myself.  I personally evaluated the patient during the encounter.  This 42 year old noncompliant diabetic has a history of small vessel coronary artery disease, has no doctor and does not take his metformin, cannot afford his medications, had cardiac catheterization in January 2013, has chronic back pain and chronic abdominal pain for months if not years, presented today with about a one hour spell prior to arrival of diffuse chest pain and abdominal pain with worse than usual abdominal pain with accompanying new chest pain felt as a pressure and squeezing and tightness and dull and aching and burning and sharp and stabbing nonexertional nonpleuritic worse with palpation over the abdomen with nausea no vomiting no diarrhea no bloody stools no shortness of breath no syncope no trauma and no improvement with aspirin and nitroglycerin prior to arrival with his chest wall nontender examine his abdomen revealing diffuse mild tenderness without rebound, due to his history of known small vessel coronary artery disease with uncontrolled diabetes we have paged unassigned medicine to consider observation for the patient's chest pain syndrome in exacerbation of chronic abdominal pain. ECG and initial troponin unremarkable. Patient feels much improved with chest discomfort almost gone after Zofran and Pepcid the ED with abdominal pain also improved but not back to his baseline.  D/w OPC will see Pt in ED and place Pt in Obs and per [facility] request d/w [facility] who will consult tonight. 2020  Hurman Horn, MD 03/29/13 [phone]

## 2013-03-21 DIAGNOSIS — E785 Hyperlipidemia, unspecified: Secondary | ICD-10-CM

## 2013-03-21 DIAGNOSIS — E119 Type 2 diabetes mellitus without complications: Secondary | ICD-10-CM

## 2013-03-21 DIAGNOSIS — R079 Chest pain, unspecified: Secondary | ICD-10-CM

## 2013-03-21 DIAGNOSIS — Z9119 Patient's noncompliance with other medical treatment and regimen: Secondary | ICD-10-CM

## 2013-03-21 DIAGNOSIS — I2510 Atherosclerotic heart disease of native coronary artery without angina pectoris: Secondary | ICD-10-CM

## 2013-03-21 LAB — BASIC METABOLIC PANEL
BUN: 8 mg/dL (ref 6–23)
CO2: 30 mEq/L (ref 19–32)
Calcium: 8.5 mg/dL (ref 8.4–10.5)
Chloride: 101 mEq/L (ref 96–112)
Glucose, Bld: 298 mg/dL — ABNORMAL HIGH (ref 70–99)
Potassium: 3.6 mEq/L (ref 3.5–5.1)
Sodium: 135 mEq/L (ref 135–145)

## 2013-03-21 LAB — LIPID PANEL
HDL: 30 mg/dL — ABNORMAL LOW (ref >39)
Total CHOL/HDL Ratio: 5.6 RATIO
Triglycerides: 193 mg/dL — ABNORMAL HIGH (ref <150)
VLDL: 39 mg/dL (ref 0–40)

## 2013-03-21 LAB — GLUCOSE, CAPILLARY: Glucose-Capillary: 231 mg/dL — ABNORMAL HIGH (ref 70–99)

## 2013-03-21 LAB — CBC
HCT: 39.1 % (ref 39.0–52.0)
Hemoglobin: 13 g/dL (ref 13.0–17.0)
MCH: 27.7 pg (ref 26.0–34.0)
MCHC: 33.2 g/dL (ref 30.0–36.0)
MCV: 83.2 fL (ref 78.0–100.0)
Platelets: 256 10*3/uL (ref 150–400)
RBC: 4.7 MIL/uL (ref 4.22–5.81)
RDW: 13.3 % (ref 11.5–15.5)
WBC: 8.3 10*3/uL (ref 4.0–10.5)

## 2013-03-21 LAB — TROPONIN I: Troponin I: <0.3 ng/mL (ref <0.30)

## 2013-03-21 MED ORDER — LOVASTATIN 10 MG PO TABS: 10.0000 mg | ORAL_TABLET | Freq: Every day | ORAL | Status: DC

## 2013-03-21 MED ORDER — ASPIRIN 81 MG PO TBEC: 81.0000 mg | DELAYED_RELEASE_TABLET | Freq: Every day | ORAL | Status: DC

## 2013-03-21 MED ORDER — INSULIN GLARGINE 100 UNIT/ML ~~LOC~~ SOLN: 25.0000 [IU] | Freq: Every day | SUBCUTANEOUS | Status: DC

## 2013-03-21 MED ORDER — WAVESENSE KEYNOTE PRO METER DEVI: 1.0000 | Freq: Once | Status: DC

## 2013-03-21 MED ORDER — METFORMIN HCL 500 MG PO TABS: 1000.0000 mg | ORAL_TABLET | Freq: Two times a day (BID) | ORAL | Status: DC

## 2013-03-21 MED ORDER — OXYCODONE-ACETAMINOPHEN 5-325 MG PO TABS
2.0000 | ORAL_TABLET | Freq: Once | ORAL | Status: AC
  Administered 2013-03-21: 2 via ORAL
  Filled 2013-03-21: qty 2

## 2013-03-21 NOTE — Progress Notes (Signed)
  Progress Note  Lawrence Pearson is a 42 yo patient - previously seen by Dr. Jacinto Halim.  He had a cardiac cath last year which revealed small vessel disease but no significant stenosis to explain his rest pain.    Subjective:  Denies CP or dyspnea  Objective:  Filed Vitals:   03/20/13 2259 03/20/13 2337 03/21/13 0500 03/21/13 0612  BP: 120/89 115/75 88/56 97/65   Pulse: 79 79 77   Temp: 97.9 F (36.6 C) 97.4 F (36.3 C) 97.5 F (36.4 C)   TempSrc: Oral     Resp: 16 20 16    Height:  5\' 6"  (1.676 m)    Weight:  240 lb (108.863 kg) 242 lb 14.4 oz (110.179 kg)   SpO2: 97% 93% 93%     Intake/Output from previous day:  Intake/Output Summary (Last 24 hours) at 03/21/13 1036 Last data filed at 03/21/13 0500  Gross per 24 hour  Intake    360 ml  Output      0 ml  Net    360 ml    PHYSICAL EXAM: No acute distress Neck: no JVD Cardiac:  normal S1, S2; RRR; no murmur Lungs:  clear to auscultation bilaterally, no wheezing, rhonchi or rales Abd: soft, nontender, no hepatomegaly Ext: no edema Skin: warm and dry Neuro:  CNs 2-12 intact, no focal abnormalities noted   Lab Results: Basic Metabolic Panel:  Recent Labs  65/78/46 1826 03/21/13 0232  NA 130* 135  K 3.5 3.6  CL 98 101  CO2 22 30  GLUCOSE 380* 298*  BUN 8 8  CREATININE 0.65 0.80  CALCIUM 9.0 8.5   CBC:  Recent Labs  03/20/13 1826 03/21/13 0232  WBC 8.5 8.3  NEUTROABS 4.6  --   HGB 14.1 13.0  HCT 40.7 39.1  MCV 82.2 83.2  PLT 287 256   Cardiac Enzymes:  Recent Labs  03/21/13 0233 03/21/13 0920  TROPONINI <0.30 <0.30     Assessment/Plan:   1. Chest Pain:  Troponins negative so far.  Probably MSK pain.   2. Minimal Coronary Plaque:  Cardiac cath done in 2013 with insignificant CAD.  Continue risk factor modification with statin, ASA.  Doubtful he has had progression of disease.  Has seen Dr. Jacinto Halim in the past.  Will ask him to assume cardiology care.  3. Tobacco Abuse: recommend cessation.  Signed,   Lawrence Newcomer, Lawrence Pearson  10:36 AM 03/21/2013    Attending Note:   The patient was seen and examined.  Agree with assessment and plan as noted above.  Changes made to the above note as needed.  Pt is feeling better..  Enzymes are negative.  I have talked to Dr. Jacinto Halim who will assume cardiology care tomorrow.    Will have Lawrence Pearson follow up with Dr. Jacinto Halim as OP.    Lawrence Pearson, Lawrence Hageman., Lawrence Pearson, [facility] 03/21/2013, 10:59 AM

## 2013-03-21 NOTE — H&P (Signed)
Internal Medicine Teaching Service Attending Note Date: 03/21/2013  Patient name: CLANCE BAQUERO  Medical record number: [id number]  Date of birth: 07-18-1971   I have seen and evaluated Axel Filler and discussed their care with the Residency Team. Please see Dr Gordy Councilman H&P for full details. Mr Lamba is a 42 yo man with poorly controlled DM and small vessel CAD. He had onset of substernal CP at sternum, L axilla, and into his back. This occurred at rest and he called 911. SL NTG in EMS really didn't help but time (overnight) the pain reduced sig.    PMHx, meds, allergies, soc hx, fam hx, and ROS were reviewed and were sig for a cardiac cath 2013 with small vessel dz and medical mgmt was rec.   Soc Hx : he has the orange card that was obtained when HS was still operating. It is still valid. He doesn't drive but his daughter drives him to appts. He cares for his ill mother. He uses [facility].   Physical Exam: Blood pressure 97/65, pulse 77, temperature 97.5 F (36.4 C), temperature source Oral, resp. rate 16, height 5\' 6"  (1.676 m), weight 242 lb 14.4 oz (110.179 kg), SpO2 93.00%. Gen : Very pleasant man in NAD. Lying in bed. HRRR No MRG. TTP over sternum. LCTAB ABD + BS, S / NT / ND Ext : no edema Skin : no lesions Neuro : no focal  Labs and imaging were reviewed which were pertinent for hyperglycemia.  Assessment and Plan: I agree with the formulated Assessment and Plan with the following changes:   1. Atypical CP - The pt has had three negative Trop I and his EKG's are not c/w ACS. He is stable for D/C home with oupt F/U and on ASA and statin. Will need RF mgmt.   2. DM II poorly controlled - Agree with addition of insulin. Pt has used NPH insulin in past and had no problems injecting it. Will D/C on Lantus and see if we have a sample pen to provide him at D/C. Cont his metformin. He is able to go to [facility] tomorrow for F/U and DM mgmt.  3. Chronic pain - Pt states he has chronic back  and ABD pain. His 2010 MRI showed L4-5 B foraminal encroachment, L > R with a shallow L foraminal disc protrusion which comes close to contacting the exiting left L4 nerve root. Has been on chronic opioids in past per pt. [place] controlled substances database lists only #15 oxy 5 mg at last D/C on Feb 23rd. Pt was given oxy 5 mg 2 pills on day of admission and prior to D/C along with his NSAID. He will need chronic pain mgmt as an outpt.   4. Dispo - stable for D/C home. He takes care of his ill mother so a walk in clinic is more suited to his health care needs than the [facility] which requires a set day and timed appt. He was given instructions to the [facility] chronic care clinic and has a ride to go tomorrow.  Burns Spain, MD 4/6/201411:57 AM

## 2013-03-21 NOTE — Progress Notes (Signed)
Utilization review completed.  

## 2013-03-21 NOTE — Progress Notes (Signed)
Subjective:  Patient is doing well this morning. He has not had chest pain since last night. Pain is 0/10 but he still feels something in his chest.  Patient states that his father had his first MI in his early 9s as well as several of his aunts and uncles. Patient's father died at age 44 from complications of diabetes.  On further history, the patient states that he does not have a PCP as he does not have insurance or a job. He has been taking metformin sporadically which was prescribed at a recent hospital discharge. He also has not taken any cardiac medications in some time. He lives with his mother.  He states that he is ready to start taking daily medications to improve his health.  Objective: Vital signs in last 24 hours: Filed Vitals:   03/20/13 2259 03/20/13 2337 03/21/13 0500 03/21/13 0612  BP: 120/89 115/75 88/56 97/65   Pulse: 79 79 77   Temp: 97.9 F (36.6 C) 97.4 F (36.3 C) 97.5 F (36.4 C)   TempSrc: Oral     Resp: 16 20 16    Height:  5\' 6"  (1.676 m)    Weight:  240 lb (108.863 kg) 242 lb 14.4 oz (110.179 kg)   SpO2: 97% 93% 93%    Weight change:   Intake/Output Summary (Last 24 hours) at 03/21/13 1201 Last data filed at 03/21/13 0900  Gross per 24 hour  Intake    720 ml  Output      0 ml  Net    720 ml    Physical Exam Blood pressure 97/65, pulse 77, temperature 97.5 F (36.4 C), temperature source Oral, resp. rate 16, height 5\' 6"  (1.676 m), weight 242 lb 14.4 oz (110.179 kg), SpO2 93.00%. General:  No acute distress, alert and oriented x 3, well-appearing  HEENT:  PERRL, EOMI, moist mucous membranes Cardiovascular:  Regular rate and rhythm, no murmurs, rubs or gallops. Mild tenderness on deep palpation of sternum. Respiratory:  Clear to auscultation bilaterally, no wheezes, rales, or rhonchi Abdomen:  Soft, obese, nondistended, nontender, bowel sounds present Extremities:  Warm and well-perfused, no edema.  Skin: Warm, dry, no rashes Neuro: Not anxious  appearing, no depressed mood, normal affect  Lab Results: Basic Metabolic Panel:  Recent Labs Lab 03/20/13 1826 03/21/13 0232  NA 130* 135  K 3.5 3.6  CL 98 101  CO2 22 30  GLUCOSE 380* 298*  BUN 8 8  CREATININE 0.65 0.80  CALCIUM 9.0 8.5   Liver Function Tests:  Recent Labs Lab 03/20/13 1826  AST 16  ALT 17  ALKPHOS 87  BILITOT 0.2*  PROT 6.4  ALBUMIN 3.2*    Recent Labs Lab 03/20/13 1826  LIPASE 32   CBC:  Recent Labs Lab 03/20/13 1826 03/21/13 0232  WBC 8.5 8.3  NEUTROABS 4.6  --   HGB 14.1 13.0  HCT 40.7 39.1  MCV 82.2 83.2  PLT 287 256   Cardiac Enzymes:  Recent Labs Lab 03/21/13 0233 03/21/13 0920  TROPONINI <0.30 <0.30    Recent Labs Lab 03/21/13 0232  CHOL 168  HDL 30*  LDLCALC 99  TRIG 045*  CHOLHDL 5.6   Urine Drug Screen: Drugs of Abuse     Component Value Date/Time   LABOPIA NONE DETECTED 02/06/2013 0058   COCAINSCRNUR NONE DETECTED 02/06/2013 0058   LABBENZ NONE DETECTED 02/06/2013 0058   AMPHETMU NONE DETECTED 02/06/2013 0058   THCU POSITIVE* 02/06/2013 0058   LABBARB NONE DETECTED 02/06/2013 0058  Alcohol Level: No results found for this basename: ETH,  in the last 168 hours Urinalysis:  Recent Labs Lab 03/20/13 1846  COLORURINE YELLOW  LABSPEC 1.005  PHURINE 5.5  GLUCOSEU >1000*  HGBUR NEGATIVE  BILIRUBINUR NEGATIVE  KETONESUR NEGATIVE  PROTEINUR NEGATIVE  UROBILINOGEN 0.2  NITRITE NEGATIVE  LEUKOCYTESUR NEGATIVE     Studies/Results: Dg Chest Port 1 View  03/20/2013  *RADIOLOGY REPORT*  Clinical Data: Left-sided chest pain for 1 hour.  Shortness of breath.  History of smoking, diabetes, hypertension.  No medications.  PORTABLE CHEST - 1 VIEW  Comparison: 01/01/2012  Findings: Heart size is accentuated by the portable technique. There are no focal consolidations or pleural effusions.  Mildly prominent interstitial markings appear likely chronic.  No evidence for pulmonary edema.  IMPRESSION: No evidence for  acute cardiopulmonary abnormality.   Original Report Authenticated By: Norva Pavlov, M.D.    Medications:  Medications reviewed  Scheduled Meds: . aspirin EC  81 mg Oral Daily  . atorvastatin  10 mg Oral q1800  . enoxaparin (LOVENOX) injection  40 mg Subcutaneous Daily  . insulin aspart  0-15 Units Subcutaneous TID WC  . insulin aspart  0-5 Units Subcutaneous QHS  . insulin glargine  20 Units Subcutaneous QHS  . sodium chloride  3 mL Intravenous Q12H  . sodium chloride  3 mL Intravenous Q12H   Continuous Infusions:  PRN Meds:.sodium chloride, ibuprofen, sodium chloride  Assessment/Plan:  42 yo M with PMH DM II, bladder cancer s/p surgery, HTN, chronic back pain, chronic abdominal pain, presents to the ED with chest pain that began approximately 45 minutes prior to coming to the ED.   Chest pain:  Resolved. Patient with story consistent with cardiac chest pain yet troponins negative x3. Patient is feeling better this morning and states that he thinks his pain was just due to stress. He does have a significant family history of coronary artery disease, and his cath from January 2013 showed mild nonfocal CAD. Therefore, we will risk stratify and arrange followup as an outpatient. Labs overnight showed LDL 99, HDL 30. EKG this morning with no acute changes.  - Continue as outpatient: ASA 81mg  daily, Lipitor 10mg  daily  - F/u with Cardiology, appreciate recommendations   2. DM II, poorly controlled:  A1c is 11.3. Patient noncompliant with medications, supposed to be on metformin at home but takes it sporadically and does not have regular refills. We will start Lantus and give patient samples and instructions. Patient states that he will followup with [facility]. His sister will drive him. Patient prefers urgent care followup as this would not require regular appointments. Patient does not have insurance, but he has the orange card discount program. He may benefit  from getting insulin from the [facility] program  - Lantus 20u qhs  -Will give a prescription for metformin - [employer]SI  - Diabetes coordinator consult  - SW consult to provide community resource guide   3. Tobacco abuse: Smokes 1ppd x25ys.  - SW consult for cessation -Followup as an outpatient  DVT PPx: Lovenox   Dispo:  - Patient is ready for discharge with followup at [facility] urgent care The patient does not have a current PCP (No PCP Per Patient) but will be following up with [facility] per patient preference.  The patient does not have transportation limitations that hinder transportation to clinic appointments.    LOS: 1 day   [place] Ar 03/21/2013, 12:01 PM

## 2013-03-22 ENCOUNTER — Emergency Department (INDEPENDENT_AMBULATORY_CARE_PROVIDER_SITE_OTHER)
Admission: EM | Admit: 2013-03-22 | Discharge: 2013-03-22 | Disposition: A | Discharge status: Home / Self Care | Payer: Self-pay | Source: Home / Self Care

## 2013-03-22 ENCOUNTER — Encounter (HOSPITAL_COMMUNITY): Payer: Self-pay | Admitting: Emergency Medicine

## 2013-03-22 DIAGNOSIS — K0889 Other specified disorders of teeth and supporting structures: Secondary | ICD-10-CM

## 2013-03-22 DIAGNOSIS — K089 Disorder of teeth and supporting structures, unspecified: Secondary | ICD-10-CM

## 2013-03-22 MED ORDER — CLINDAMYCIN HCL 300 MG PO CAPS: 300.0000 mg | ORAL_CAPSULE | Freq: Four times a day (QID) | ORAL | Status: DC

## 2013-03-22 NOTE — ED Notes (Signed)
Patient Demographics  Lawrence Pearson, is a 42 y.o. male  ZOX:[id number]  WJX:[id number]  DOB - 12-24-1970  No chief complaint on file.       Subjective:   Lawrence Pearson  with history of diabetes, hypertension, bladder cancer, chronic left lower second incisor tooth pain, who was recently admitted to [facility] for muscular skeletal chest pain and was discharged, comes here for routine followup, he says he is in [place] card but he prefers not to take appointments with any physician and follow up with urgent care as needed. He comes in today as he was told upon discharge to followup with the physician post discharge, he has no chest pain, no shortness of breath, no abdominal pain. His only complaint is his left second lower incisor is hurting  For the last few days, he says for the last 1-2 years on intermittent basis he gets pain in this tooth. Denies any jaw swelling, denies any fever chills, denies any puslike discharge.     Objective:   Past Medical History  Diagnosis Date  . Diabetes mellitus   . Bladder cancer   . Hypertension   . Chronic back pain       Past Surgical History  Procedure Laterality Date  . Bladder surgery      bladder cancer surgery  . Thumb surgery       Filed Vitals:   03/22/13 1711  BP: 132/88  Pulse: 99  Temp: 98.7 F (37.1 C)  TempSrc: Oral  Resp: 20  SpO2: 99%     Exam  Awake Alert, Oriented X 3, No new F.N deficits, Normal affect [place].AT,PERRAL Supple Neck,No JVD, No cervical lymphadenopathy appriciated.  Symmetrical Chest wall movement, Good air movement bilaterally, CTAB RRR,No Gallops,Rubs or new Murmurs, No Parasternal Heave +ve B.Sounds, Abd Soft, Non tender, No organomegaly appriciated, No rebound - guarding or rigidity. No Cyanosis, Clubbing or edema, No new Rash or bruise     creamy poor overall dentition, left lower second incisor slightly tender to touch, no obvious swelling or abscess.   Data Review    CBC  Recent Labs Lab 03/20/13 1826 03/21/13 0232  WBC 8.5 8.3  HGB 14.1 13.0  HCT 40.7 39.1  PLT 287 256  MCV 82.2 83.2  MCH 28.5 27.7  MCHC 34.6 33.2  RDW 13.2 13.3  LYMPHSABS 3.1  --   MONOABS 0.6  --   EOSABS 0.1  --   BASOSABS 0.1  --     Chemistries    Recent Labs Lab 03/20/13 1826 03/21/13 0232  NA 130* 135  K 3.5 3.6  CL 98 101  CO2 22 30  GLUCOSE 380* 298*  BUN 8 8  CREATININE 0.65 0.80  CALCIUM 9.0 8.5  AST 16  --   ALT 17  --   ALKPHOS 87  --   BILITOT 0.2*  --    ------------------------------------------------------------------------------------------------------------------ No results found for this basename: HGBA1C,  in the last 72 hours ------------------------------------------------------------------------------------------------------------------  Recent Labs  03/21/13 0232  CHOL 168  HDL 30*  LDLCALC 99  TRIG 213*  CHOLHDL 5.6   ------------------------------------------------------------------------------------------------------------------ No results found for this basename: TSH, T4TOTAL, FREET3, T3FREE, THYROIDAB,  in the last 72 hours ------------------------------------------------------------------------------------------------------------------ No results found for this basename: VITAMINB12, FOLATE, FERRITIN, TIBC, IRON, RETICCTPCT,  in the last 72 hours  Coagulation profile  No results found for this basename: INR, PROTIME,  in the last 168 hours     Prior to Admission medications  Medication Sig Start Date End Date Taking? Authorizing Provider  aspirin EC 81 MG EC tablet Take 1 tablet (81 mg total) by mouth daily. 03/21/13   Larey Seat, MD  Blood Glucose Monitoring Suppl (WAVESENSE KEYNOTE PRO METER) DEVI 1 kit by Does not apply route once. 03/21/13   Judie Bonus, MD  clindamycin (CLEOCIN) 300 MG capsule Take 1 capsule (300 mg total) by mouth 4 (four) times daily. 03/22/13   Leroy Sea, MD  ibuprofen  (ADVIL,MOTRIN) 200 MG tablet Take 400-800 mg by mouth every 6 (six) hours as needed for pain. For pain    Historical Provider, MD  insulin glargine (LANTUS) 100 UNIT/ML injection Inject 0.25 mLs (25 Units total) into the skin at bedtime. 03/21/13   Larey Seat, MD  lovastatin (MEVACOR) 10 MG tablet Take 1 tablet (10 mg total) by mouth at bedtime. 03/21/13   Larey Seat, MD  metFORMIN (GLUCOPHAGE) 500 MG tablet Take 2 tablets (1,000 mg total) by mouth 2 (two) times daily with a meal. 03/21/13   Larey Seat, MD     Assessment & Plan    1. Left lower second incisor acute on chronic pain. This has been present for the last 1-2 years, patient already given Motrin upon hospital discharge he has been recommended to continue that along with hydration, short course of clindamycin, information provided on prompt dentist followup. Patient has agreed to do so.    2.Diabetes, hypertension. Stable. Continue home medications, counseled on heart healthy low carbohydrate diet, recommended Accu-Cheks q. a.c. at bedtime and to maintain a logbook. Counseled to followup with adult care clinic more so as he has an orange card now, patient does agree to do so.    Follow-up Information   Schedule an appointment as soon as possible for a visit with Adult Care clinic.   Contact information:   For your chronic medical problems      Follow up with Bradly Chris, DDS. Schedule an appointment as soon as possible for a visit in 2 days. (For your toothache)    Contact information:   [address] [place] [address] [phone]        Leroy Sea M.D on 03/22/2013 at 5:20 PM   Leroy Sea, MD 03/22/13 [phone]

## 2013-03-22 NOTE — ED Notes (Signed)
Pt was seen in ED 03-20-13 and told to F/u with Urgent Care. No new medical problems.

## 2013-03-23 NOTE — Discharge Summary (Signed)
Internal Medicine Teaching [facility] Discharge Note  Name: Lawrence Pearson MRN: [id number] DOB: [birth year] 42 y.o.  Date of Admission: 03/20/2013  5:47 PM Date of Discharge: 03/21/2013  Attending Physician: Dr. Rogelia Boga  Discharge Diagnosis: Active Problems:   Chest pain   Coronary artery disease   Dyslipidemia   Uncontrolled diabetes mellitus   Medically noncompliant   Discharge Medications:   Medication List    TAKE these medications       aspirin 81 MG EC tablet  Take 1 tablet (81 mg total) by mouth daily.     ibuprofen 200 MG tablet  Commonly known as:  ADVIL,MOTRIN  Take 400-800 mg by mouth every 6 (six) hours as needed for pain. For pain     insulin glargine 100 UNIT/ML injection  Commonly known as:  LANTUS  Inject 0.25 mLs (25 Units total) into the skin at bedtime.     lovastatin 10 MG tablet  Commonly known as:  MEVACOR  Take 1 tablet (10 mg total) by mouth at bedtime.     metFORMIN 500 MG tablet  Commonly known as:  GLUCOPHAGE  Take 2 tablets (1,000 mg total) by mouth 2 (two) times daily with a meal.     WAVESENSE KEYNOTE PRO METER Devi  1 kit by Does not apply route once.        Disposition and follow-up:   Lawrence Pearson was discharged from [facility] in Stable condition.  At the hospital follow up visit please address the following:  -Followup on diabetes regimen, and ensure that patient receive glucometer from health department and is using Lantus correctly -Followup chest pain and risk stratification -Smoking cessation counseling -Regular medication refills   Follow-up Appointments: Follow-up Information   Follow up with [facility]. (please go to urgent care tomorrow for a follow up visit)    Contact information:   [address] [place] [address] [phone]      Follow up with [facility]. (Please pick up your glucose meter prescription from the health  department)    Contact information:   [address] [place], [place] [address]       Consultations: Treatment Team:  Laurey Morale, MD  Procedures Performed:  Dg Chest Port 1 View  03/20/2013  *RADIOLOGY REPORT*  Clinical Data: Left-sided chest pain for 1 hour.  Shortness of breath.  History of smoking, diabetes, hypertension.  No medications.  PORTABLE CHEST - 1 VIEW  Comparison: 01/01/2012  Findings: Heart size is accentuated by the portable technique. There are no focal consolidations or pleural effusions.  Mildly prominent interstitial markings appear likely chronic.  No evidence for pulmonary edema.  IMPRESSION: No evidence for acute cardiopulmonary abnormality.   Original Report Authenticated By: Norva Pavlov, M.D.     Admission HPI: 42 yo M with PMH DM II, bladder cancer s/p surgery, HTN, chronic back pain, chronic abdominal pain, and CAD, presents to the ED with chest pain that began approximately 45 minutes prior to coming to the ED. Pt reports that he was at rest when his chest pain began. It started in his left axilla and spread into his left chest/mid sternum with radiation into his back. He describes the pain as more of an intermittent pressure-type sensation that worsened and he became short of breath. The pain is now constant. EMS was called and gave the patient ASA 324mg  and 2 Nitroglycerin, which provided some pain relief. He also endorses  nausea and worsening abdominal pain. He was seen in January '13 for chest pain. A heart catheterization was performed by Cardiology, which showed mild diffuse small vessel disease. Medical therapy was recommended including Aspirin, Lisinopril 2.5qd, Metoprolol 12.5 mg bid and Crestor 40 mg qhs; however, he has not been taking these due to financial constraints.  He denies fevers, chills, emesis, changes in vision, diarrhea, constipation, urinary symptoms, weakness, or confusion.  Per EMS, CBG was 480. Per EMS, pt is supposed to be on  medication for his DM but rarely takes it b/c he cannot afford it. He reported to [place] that he does usually take the Metformin but could not afford the other medicines prescribed back in 12/2011. He also rarely checks his blood glucose levels.   Hospital Course by problem list: Active Problems:   Chest pain   Coronary artery disease   Dyslipidemia   Uncontrolled diabetes mellitus   Medically noncompliant   Chest pain Patient presented with chest pain concerning for cardiac causes given history, hx of CAD on previous cardiac cath, and risk factors. EKG with no acute ST elevation or depression or acute changes. Troponins were cycled and were negative. Cath from Jan 2013 showed mild nonfocal CAD. Patient has not had regular medical care, has trouble affording medications and does not have insurance. He expressed that he would like to get a better handle on his health and appeared motivated to take medications regularly. He elected to be followed at [facility] rather than the clinic. He was given prescriptions and instruction on taking his medications. Rx given for statin on $4 list.  DM Type 2 A1c found to be 11.3 on this admission. He is noncompliant with medications at home and has been using an old prescription. Patient was given a sample of lantus pens and was able to administer the medication himself. He was given a Rx for a glucometer at the health department. He will follow with DM the day after discharge at Urgent Care. Rx given for metformin.   Discharge Vitals:  BP 97/65  Pulse 77  Temp(Src) 97.5 F (36.4 C) (Oral)  Resp 16  Ht 5\' 6"  (1.676 m)  Wt 242 lb 14.4 oz (110.179 kg)  BMI 39.22 kg/m2  SpO2 93%   Signed: Denton Ar 03/24/2013, 4:56 PM   Time Spent on Discharge: 25 minutes Services Ordered on Discharge: none Equipment Ordered on Discharge: none

## 2013-10-20 ENCOUNTER — Emergency Department (HOSPITAL_COMMUNITY)
Admission: EM | Admit: 2013-10-20 | Discharge: 2013-10-20 | Disposition: A | Discharge status: Home / Self Care | Payer: Self-pay | Attending: Emergency Medicine | Admitting: Emergency Medicine

## 2013-10-20 ENCOUNTER — Encounter (HOSPITAL_COMMUNITY): Payer: Self-pay | Admitting: Emergency Medicine

## 2013-10-20 DIAGNOSIS — Z8551 Personal history of malignant neoplasm of bladder: Secondary | ICD-10-CM | POA: Insufficient documentation

## 2013-10-20 DIAGNOSIS — Z880 Allergy status to penicillin: Secondary | ICD-10-CM | POA: Insufficient documentation

## 2013-10-20 DIAGNOSIS — Z7982 Long term (current) use of aspirin: Secondary | ICD-10-CM | POA: Insufficient documentation

## 2013-10-20 DIAGNOSIS — E119 Type 2 diabetes mellitus without complications: Secondary | ICD-10-CM | POA: Insufficient documentation

## 2013-10-20 DIAGNOSIS — R319 Hematuria, unspecified: Secondary | ICD-10-CM | POA: Insufficient documentation

## 2013-10-20 DIAGNOSIS — F17200 Nicotine dependence, unspecified, uncomplicated: Secondary | ICD-10-CM | POA: Insufficient documentation

## 2013-10-20 DIAGNOSIS — G8929 Other chronic pain: Secondary | ICD-10-CM | POA: Insufficient documentation

## 2013-10-20 DIAGNOSIS — Z79899 Other long term (current) drug therapy: Secondary | ICD-10-CM | POA: Insufficient documentation

## 2013-10-20 DIAGNOSIS — Z794 Long term (current) use of insulin: Secondary | ICD-10-CM | POA: Insufficient documentation

## 2013-10-20 DIAGNOSIS — I10 Essential (primary) hypertension: Secondary | ICD-10-CM | POA: Insufficient documentation

## 2013-10-20 LAB — CBC WITH DIFFERENTIAL/PLATELET
Basophils Absolute: 0.1 10*3/uL (ref 0.0–0.1)
Basophils Relative: 1 % (ref 0–1)
Hemoglobin: 15.6 g/dL (ref 13.0–17.0)
MCHC: 33.7 g/dL (ref 30.0–36.0)
Neutro Abs: 7.3 10*3/uL (ref 1.7–7.7)
Neutrophils Relative %: 72 % (ref 43–77)
RDW: 14.3 % (ref 11.5–15.5)

## 2013-10-20 LAB — URINALYSIS, ROUTINE W REFLEX MICROSCOPIC
Bilirubin Urine: NEGATIVE
Glucose, UA: NEGATIVE mg/dL
Leukocytes, UA: NEGATIVE
Nitrite: NEGATIVE
Specific Gravity, Urine: 1.003 — ABNORMAL LOW (ref 1.005–1.030)
pH: 6 (ref 5.0–8.0)

## 2013-10-20 LAB — BASIC METABOLIC PANEL
BUN: 7 mg/dL (ref 6–23)
CO2: 26 mEq/L (ref 19–32)
Calcium: 9.4 mg/dL (ref 8.4–10.5)
Chloride: 98 mEq/L (ref 96–112)
Creatinine, Ser: 0.51 mg/dL (ref 0.50–1.35)
GFR calc Af Amer: >90 mL/min (ref >90)
GFR calc non Af Amer: >90 mL/min (ref >90)
Glucose, Bld: 169 mg/dL — ABNORMAL HIGH (ref 70–99)
Potassium: 3.4 mEq/L — ABNORMAL LOW (ref 3.5–5.1)
Sodium: 133 mEq/L — ABNORMAL LOW (ref 135–145)

## 2013-10-20 LAB — URINE MICROSCOPIC-ADD ON

## 2013-10-20 MED ORDER — OXYCODONE-ACETAMINOPHEN 5-325 MG PO TABS
2.0000 | ORAL_TABLET | Freq: Once | ORAL | Status: AC
  Administered 2013-10-20: 2 via ORAL
  Filled 2013-10-20: qty 2

## 2013-10-20 MED ORDER — OXYCODONE-ACETAMINOPHEN 5-325 MG PO TABS: 2.0000 | ORAL_TABLET | Freq: Four times a day (QID) | ORAL | Status: DC | PRN

## 2013-10-20 NOTE — ED Notes (Signed)
Pt. reports intermittent hematuria for 3 weeks , denies injury , no dysuria , pt. stated history bladder cancer.

## 2013-10-20 NOTE — ED Notes (Signed)
Pt called times x1 no answer

## 2013-10-21 NOTE — ED Provider Notes (Signed)
Medical screening examination/treatment/procedure(s) were performed by non-physician practitioner and as supervising physician I was immediately available for consultation/collaboration.  Alin Chavira L Riyan Haile, MD 10/21/13 0034 

## 2013-10-21 NOTE — ED Provider Notes (Signed)
CSN: [id number]     Arrival date & time 10/20/13  2031 History   First MD Initiated Contact with Patient 10/20/13 2213     Chief Complaint  Patient presents with  . Hematuria   (Consider location/radiation/quality/duration/timing/severity/associated sxs/prior Treatment) HPI Comments: Patient presents emergency department with chief complaint of hematuria. States the symptoms have been intermittent for the past 3 weeks. He denies any dysuria. States that he has had a history of bladder cancer approximately 5 years ago. He is followed by Dr. Retta Diones. He denies fevers, chills, nausea, or vomiting.  States that he has had some pain over the urinary bladder.  The history is provided by the patient. No language interpreter was used.    Past Medical History  Diagnosis Date  . Diabetes mellitus   . Bladder cancer   . Hypertension   . Chronic back pain    Past Surgical History  Procedure Laterality Date  . Bladder surgery      bladder cancer surgery  . Thumb surgery     Family History  Problem Relation Age of Onset  . Coronary artery disease Father    History  Substance Use Topics  . Smoking status: Current Every Day Smoker -- 5 years    Types: Cigarettes  . Smokeless tobacco: Never Used  . Alcohol Use: Yes    Review of Systems  All other systems reviewed and are negative.    Allergies  Penicillins  Home Medications   Current Outpatient Rx  Name  Route  Sig  Dispense  Refill  . aspirin EC 81 MG EC tablet   Oral   Take 1 tablet (81 mg total) by mouth daily.   30 tablet      . ibuprofen (ADVIL,MOTRIN) 200 MG tablet   Oral   Take 800 mg by mouth every 6 (six) hours as needed for pain. For pain         . insulin glargine (LANTUS) 100 UNIT/ML injection   Subcutaneous   Inject 0.25 mLs (25 Units total) into the skin at bedtime.   10 mL      . metFORMIN (GLUCOPHAGE) 500 MG tablet   Oral   Take 2 tablets (1,000 mg total) by mouth 2 (two) times daily with a  meal.   60 tablet   6   . Pseudoephedrine-APAP-DM (DAYQUIL MULTI-SYMPTOM PO)   Oral   Take 2 capsules by mouth daily as needed (for cold).         Marland Kitchen oxyCODONE-acetaminophen (PERCOCET/ROXICET) 5-325 MG per tablet   Oral   Take 2 tablets by mouth every 6 (six) hours as needed for severe pain.   15 tablet   0    BP 138/90  Pulse 95  Temp(Src) 98 F (36.7 C) (Oral)  Resp 16  Wt 243 lb (110.224 kg)  SpO2 94% Physical Exam  Nursing note and vitals reviewed. Constitutional: He is oriented to person, place, and time. He appears well-developed and well-nourished.  HENT:  Head: Normocephalic and atraumatic.  Right Ear: External ear normal.  Left Ear: External ear normal.  Nose: Nose normal.  Mouth/Throat: Oropharynx is clear and moist. No oropharyngeal exudate.  Eyes: Conjunctivae and EOM are normal. Pupils are equal, round, and reactive to light. Right eye exhibits no discharge. Left eye exhibits no discharge. No scleral icterus.  Neck: Normal range of motion. Neck supple. No JVD present.  Cardiovascular: Normal rate, regular rhythm, normal heart sounds and intact distal pulses.  Exam reveals no gallop and  no friction rub.   No murmur heard. Pulmonary/Chest: Effort normal and breath sounds normal. No respiratory distress. He has no wheezes. He has no rales. He exhibits no tenderness.  Abdominal: Soft. Bowel sounds are normal. He exhibits no distension and no mass. There is no tenderness. There is no rebound and no guarding.  Musculoskeletal: Normal range of motion. He exhibits no edema and no tenderness.  Neurological: He is alert and oriented to person, place, and time. He has normal reflexes.  CN 3-12 intact  Skin: Skin is warm and dry.  Psychiatric: He has a normal mood and affect. His behavior is normal. Judgment and thought content normal.    ED Course  Procedures (including critical care time) Labs Review Labs Reviewed  URINALYSIS, ROUTINE W REFLEX MICROSCOPIC -  Abnormal; Notable for the following:    Color, Urine STRAW (*)    Specific Gravity, Urine 1.003 (*)    Hgb urine dipstick MODERATE (*)    All other components within normal limits  BASIC METABOLIC PANEL - Abnormal; Notable for the following:    Sodium 133 (*)    Potassium 3.4 (*)    Glucose, Bld 169 (*)    All other components within normal limits  URINE CULTURE  URINE MICROSCOPIC-ADD ON  CBC WITH DIFFERENTIAL   Imaging Review No results found.  EKG Interpretation   None       MDM   1. Hematuria    Patient with hematuria. Past medical history remarkable for bladder cancer.  UA is remarkable for hgb.  Afebrile.  VSS.  Patient discussed with Dr. Effie Shy, who recommends Urology follow up.  Patient is not in any apparent distress.  I will send urine for culture.    Roxy Horseman, PA-C 10/21/13 [phone]

## 2013-10-22 LAB — URINE CULTURE: Culture: NO GROWTH

## 2014-02-22 ENCOUNTER — Emergency Department (HOSPITAL_COMMUNITY)
Admission: EM | Admit: 2014-02-22 | Discharge: 2014-02-22 | Disposition: A | Discharge status: Home / Self Care | Payer: Self-pay | Attending: Emergency Medicine | Admitting: Emergency Medicine

## 2014-02-22 ENCOUNTER — Emergency Department (HOSPITAL_COMMUNITY): Payer: Self-pay

## 2014-02-22 ENCOUNTER — Encounter (HOSPITAL_COMMUNITY): Payer: Self-pay | Admitting: Emergency Medicine

## 2014-02-22 DIAGNOSIS — Z8551 Personal history of malignant neoplasm of bladder: Secondary | ICD-10-CM | POA: Insufficient documentation

## 2014-02-22 DIAGNOSIS — G8929 Other chronic pain: Secondary | ICD-10-CM | POA: Insufficient documentation

## 2014-02-22 DIAGNOSIS — M549 Dorsalgia, unspecified: Secondary | ICD-10-CM | POA: Insufficient documentation

## 2014-02-22 DIAGNOSIS — F17200 Nicotine dependence, unspecified, uncomplicated: Secondary | ICD-10-CM | POA: Insufficient documentation

## 2014-02-22 DIAGNOSIS — R102 Pelvic and perineal pain: Secondary | ICD-10-CM

## 2014-02-22 DIAGNOSIS — Z880 Allergy status to penicillin: Secondary | ICD-10-CM | POA: Insufficient documentation

## 2014-02-22 DIAGNOSIS — R109 Unspecified abdominal pain: Secondary | ICD-10-CM | POA: Insufficient documentation

## 2014-02-22 DIAGNOSIS — E119 Type 2 diabetes mellitus without complications: Secondary | ICD-10-CM | POA: Insufficient documentation

## 2014-02-22 DIAGNOSIS — R319 Hematuria, unspecified: Secondary | ICD-10-CM | POA: Insufficient documentation

## 2014-02-22 DIAGNOSIS — I10 Essential (primary) hypertension: Secondary | ICD-10-CM | POA: Insufficient documentation

## 2014-02-22 LAB — URINALYSIS, ROUTINE W REFLEX MICROSCOPIC
Bilirubin Urine: NEGATIVE
GLUCOSE, UA: 100 mg/dL — AB
HGB URINE DIPSTICK: NEGATIVE
KETONES UR: NEGATIVE mg/dL
Leukocytes, UA: NEGATIVE
Nitrite: NEGATIVE
PROTEIN: NEGATIVE mg/dL
Specific Gravity, Urine: 1.008 (ref 1.005–1.030)
UROBILINOGEN UA: 0.2 mg/dL (ref 0.0–1.0)
pH: 6 (ref 5.0–8.0)

## 2014-02-22 LAB — COMPREHENSIVE METABOLIC PANEL
ALBUMIN: 3.7 g/dL (ref 3.5–5.2)
ALK PHOS: 58 U/L (ref 39–117)
ALT: 18 U/L (ref 0–53)
AST: 14 U/L (ref 0–37)
BILIRUBIN TOTAL: 0.2 mg/dL — AB (ref 0.3–1.2)
BUN: 16 mg/dL (ref 6–23)
CHLORIDE: 100 meq/L (ref 96–112)
CO2: 23 meq/L (ref 19–32)
Calcium: 9.7 mg/dL (ref 8.4–10.5)
Creatinine, Ser: 0.64 mg/dL (ref 0.50–1.35)
GFR calc Af Amer: >90 mL/min (ref >90)
Glucose, Bld: 130 mg/dL — ABNORMAL HIGH (ref 70–99)
POTASSIUM: 3.9 meq/L (ref 3.7–5.3)
Sodium: 136 mEq/L — ABNORMAL LOW (ref 137–147)
Total Protein: 7.3 g/dL (ref 6.0–8.3)

## 2014-02-22 LAB — CBC WITH DIFFERENTIAL/PLATELET
BASOS ABS: 0.1 10*3/uL (ref 0.0–0.1)
BASOS PCT: 1 % (ref 0–1)
Eosinophils Absolute: 0.1 10*3/uL (ref 0.0–0.7)
Eosinophils Relative: 1 % (ref 0–5)
HEMATOCRIT: 43.6 % (ref 39.0–52.0)
HEMOGLOBIN: 14.4 g/dL (ref 13.0–17.0)
LYMPHS PCT: 30 % (ref 12–46)
Lymphs Abs: 3 10*3/uL (ref 0.7–4.0)
MCH: 28.4 pg (ref 26.0–34.0)
MCHC: 33 g/dL (ref 30.0–36.0)
MCV: 86 fL (ref 78.0–100.0)
MONO ABS: 0.6 10*3/uL (ref 0.1–1.0)
MONOS PCT: 6 % (ref 3–12)
NEUTROS ABS: 6.3 10*3/uL (ref 1.7–7.7)
NEUTROS PCT: 62 % (ref 43–77)
Platelets: 350 10*3/uL (ref 150–400)
RBC: 5.07 MIL/uL (ref 4.22–5.81)
RDW: 14.2 % (ref 11.5–15.5)
WBC: 10.1 10*3/uL (ref 4.0–10.5)

## 2014-02-22 MED ORDER — ONDANSETRON 4 MG PO TBDP
8.0000 mg | ORAL_TABLET | Freq: Once | ORAL | Status: AC
  Administered 2014-02-22: 8 mg via ORAL
  Filled 2014-02-22: qty 2

## 2014-02-22 MED ORDER — OXYCODONE-ACETAMINOPHEN 5-325 MG PO TABS
1.0000 | ORAL_TABLET | ORAL | Status: DC | PRN
Start: 2014-02-22 — End: 2014-03-11

## 2014-02-22 MED ORDER — PROMETHAZINE HCL 25 MG PO TABS: 25.0000 mg | ORAL_TABLET | Freq: Four times a day (QID) | ORAL | Status: AC | PRN

## 2014-02-22 MED ORDER — OXYCODONE-ACETAMINOPHEN 5-325 MG PO TABS
2.0000 | ORAL_TABLET | Freq: Once | ORAL | Status: AC
  Administered 2014-02-22: 2 via ORAL
  Filled 2014-02-22: qty 2

## 2014-02-22 NOTE — ED Notes (Signed)
Patient transported to CT 

## 2014-02-22 NOTE — ED Provider Notes (Signed)
CSN: [id number]     Arrival date & time 02/22/14  1439 History   First MD Initiated Contact with Patient 02/22/14 1751     Chief Complaint  Patient presents with  . Abdominal Pain  . Groin Pain     (Consider location/radiation/quality/duration/timing/severity/associated sxs/prior Treatment) HPI Comments: Patient is a 43 year old male with history of diabetes, bladder cancer, hypertension, and chronic back pain who presents today with 4 days of gradually worsening abdominal pain. The pain begins near his umbilicus and radiates supra pubically and into his groin. It is a sharp pain. He had some blood in his urine 2 days ago which has resolved. He denies any urinary frequency, urgency, dysuria, nausea, vomiting, diarrhea, shortness of breath, chest pain. He reports these are the same symptoms he had when he was originally diagnosed with bladder cancer. He had the tumor surgically removed and has not been to see his urologist since that time. He was seen in the ED a few months ago and told to follow up with urology. He has not done so because he does not have the money to pay them.   The history is provided by the patient. No language interpreter was used.    Past Medical History  Diagnosis Date  . Diabetes mellitus   . Bladder cancer   . Hypertension   . Chronic back pain    Past Surgical History  Procedure Laterality Date  . Bladder surgery      bladder cancer surgery  . Thumb surgery     Family History  Problem Relation Age of Onset  . Coronary artery disease Father    History  Substance Use Topics  . Smoking status: Current Every Day Smoker -- 5 years    Types: Cigarettes  . Smokeless tobacco: Never Used  . Alcohol Use: Yes    Review of Systems  Constitutional: Negative for fever, chills and diaphoresis.  Respiratory: Negative for shortness of breath.   Cardiovascular: Negative for chest pain.  Gastrointestinal: Positive for abdominal pain. Negative for nausea, vomiting  and diarrhea.  Genitourinary: Positive for hematuria. Negative for dysuria, frequency, flank pain, discharge, difficulty urinating and penile pain.  All other systems reviewed and are negative.      Allergies  Penicillins  Home Medications   Current Outpatient Rx  Name  Route  Sig  Dispense  Refill  . ibuprofen (ADVIL,MOTRIN) 200 MG tablet   Oral   Take 800 mg by mouth every 6 (six) hours as needed for pain. For pain          BP 111/61  Pulse 96  Temp(Src) 98.4 F (36.9 C) (Oral)  Resp 12  SpO2 100% Physical Exam  Nursing note and vitals reviewed. Constitutional: He is oriented to person, place, and time. He appears well-developed and well-nourished.  Non-toxic appearance. He does not have a sickly appearance. He does not appear ill. No distress.  HENT:  Head: Normocephalic and atraumatic.  Right Ear: External ear normal.  Left Ear: External ear normal.  Nose: Nose normal.  Eyes: Conjunctivae are normal.  Neck: Normal range of motion. No tracheal deviation present.  Cardiovascular: Normal rate, regular rhythm and normal heart sounds.   Pulmonary/Chest: Effort normal and breath sounds normal. No stridor.  Abdominal: Soft. Bowel sounds are normal. He exhibits no distension. There is tenderness in the suprapubic area. There is no rigidity, no rebound and no guarding.    Musculoskeletal: Normal range of motion.  Neurological: He is alert and  oriented to person, place, and time.  Skin: Skin is warm and dry. He is not diaphoretic.  Psychiatric: He has a normal mood and affect. His behavior is normal.    ED Course  Procedures (including critical care time) Labs Review Labs Reviewed  COMPREHENSIVE METABOLIC PANEL - Abnormal; Notable for the following:    Sodium 136 (*)    Glucose, Bld 130 (*)    Total Bilirubin 0.2 (*)    All other components within normal limits  URINALYSIS, ROUTINE W REFLEX MICROSCOPIC - Abnormal; Notable for the following:    Glucose, UA 100 (*)     All other components within normal limits  URINE CULTURE  CBC WITH DIFFERENTIAL   Imaging Review Ct Abdomen Pelvis Wo Contrast  02/22/2014   CLINICAL DATA Abdomen and groin pain  EXAM CT ABDOMEN AND PELVIS WITHOUT CONTRAST  TECHNIQUE Multidetector CT imaging of the abdomen and pelvis was performed following the standard protocol without intravenous contrast.  COMPARISON September 30, 2008  FINDINGS The liver, spleen, pancreas, gallbladder, adrenal glands and kidneys are normal. There is mild chronic bilateral perinephric stranding unchanged. There is no nephrolithiasis or hydroureteronephrosis bilaterally. The aorta is normal. There is no abdominal lymphadenopathy. There is no small bowel obstruction or diverticulitis. The appendix is normal. Minimal umbilical herniation of mesenteric fat is identified.  Within the bladder, there are increased density. One of the increased density areas is inseparable from the anterior right bladder wall measuring 1.5 x 2.1 cm. There is no pelvic lymphadenopathy. There is mild atelectasis of the anterior right lung base. No acute abnormalities identified within the visualized bones.  IMPRESSION No acute abnormality identified in the abdomen.  Several areas of increased density within the bladder most are probably due to blood clots. However, there is a 1.5 x 2.1 cm increased density inseparable from anterior right lateral wall, bladder wall mass is not entirely excluded.  SIGNATURE  Electronically Signed   By: Abelardo Diesel M.D.   On: 02/22/2014 19:16     EKG Interpretation None      MDM   Final diagnoses:  Suprapubic pain    Patient presents to ED with suprapubic abdominal pain. Similar sx to prior dx of bladder cancer. Labs are unremarkable. No hematuria today. CT abd/pelvis shows 1.5 x 2.1 cm increased density inseparable from the anterior right lateral wall and a bladder mall cannot be excluded. Discussed this finding with the patient and strongly encouraged  him to follow up with urology. He agrees with plan. Patient feels improved after percocet in ED and will be sent home with rx for the same. Return instructions given. Vital signs stable for discharge. Discussed case with Dr. Tomi Bamberger who agrees with plan. Patient / Family / Caregiver informed of clinical course, understand medical decision-making process, and agree with plan.     Elwyn Lade, PA-C 02/22/14 2011

## 2014-02-22 NOTE — ED Notes (Signed)
Unable to locate patient for room x3 

## 2014-02-22 NOTE — ED Notes (Signed)
Had same type of pain 3 months ago with blood in urine and was advised to follow up with urology but pt didn't have funds to do so.

## 2014-02-22 NOTE — ED Notes (Signed)
Pt returned to desk, states he had been outside

## 2014-02-22 NOTE — Discharge Instructions (Signed)
Abdominal Pain, Adult °Many things can cause abdominal pain. Usually, abdominal pain is not caused by a disease and will improve without treatment. It can often be observed and treated at home. Your health care provider will do a physical exam and possibly order blood tests and X-rays to help determine the seriousness of your pain. However, in many cases, more time must pass before a clear cause of the pain can be found. Before that point, your health care provider may not know if you need more testing or further treatment. °HOME CARE INSTRUCTIONS  °Monitor your abdominal pain for any changes. The following actions may help to alleviate any discomfort you are experiencing: °· Only take over-the-counter or prescription medicines as directed by your health care provider. °· Do not take laxatives unless directed to do so by your health care provider. °· Try a clear liquid diet (broth, tea, or water) as directed by your health care provider. Slowly move to a bland diet as tolerated. °SEEK MEDICAL CARE IF: °· You have unexplained abdominal pain. °· You have abdominal pain associated with nausea or diarrhea. °· You have pain when you urinate or have a bowel movement. °· You experience abdominal pain that wakes you in the night. °· You have abdominal pain that is worsened or improved by eating food. °· You have abdominal pain that is worsened with eating fatty foods. °SEEK IMMEDIATE MEDICAL CARE IF:  °· Your pain does not go away within 2 hours. °· You have a fever. °· You keep throwing up (vomiting). °· Your pain is felt only in portions of the abdomen, such as the right side or the left lower portion of the abdomen. °· You pass bloody or black tarry stools. °MAKE SURE YOU: °· Understand these instructions.   °· Will watch your condition.   °· Will get help right away if you are not doing well or get worse.   °Document Released: 09/11/2005 Document Revised: 09/22/2013 Document Reviewed: 08/11/2013 °[employer]® Patient  Information ©2014 [employer]. ° °

## 2014-02-22 NOTE — ED Notes (Signed)
Onset 8 days ago blood noted in urine, lasted 4 days, then lower abd pain started 4 days ago.  No urinary symptoms, vomiting, diarrhea, fever or other symptoms noted.

## 2014-02-22 NOTE — ED Notes (Addendum)
Patient transported to [facility] @ 1840

## 2014-02-22 NOTE — ED Notes (Signed)
Pt reports 4 days of lower abdominal and groin pain. Denies nausea, vomiting, diarrhea, fever. Pt reports decreased appetite. States pain is constant and stabbing. Denies dysuria. Pt awake, alert, NAD at present.

## 2014-02-23 LAB — URINE CULTURE
Colony Count: NO GROWTH
Culture: NO GROWTH

## 2014-02-26 ENCOUNTER — Emergency Department (HOSPITAL_COMMUNITY)
Admission: EM | Admit: 2014-02-26 | Discharge: 2014-02-26 | Disposition: A | Discharge status: Home / Self Care | Payer: Self-pay | Attending: Emergency Medicine | Admitting: Emergency Medicine

## 2014-02-26 ENCOUNTER — Encounter (HOSPITAL_COMMUNITY): Payer: Self-pay | Admitting: Emergency Medicine

## 2014-02-26 DIAGNOSIS — Z880 Allergy status to penicillin: Secondary | ICD-10-CM | POA: Insufficient documentation

## 2014-02-26 DIAGNOSIS — R109 Unspecified abdominal pain: Secondary | ICD-10-CM | POA: Insufficient documentation

## 2014-02-26 DIAGNOSIS — Z8551 Personal history of malignant neoplasm of bladder: Secondary | ICD-10-CM | POA: Insufficient documentation

## 2014-02-26 DIAGNOSIS — I10 Essential (primary) hypertension: Secondary | ICD-10-CM | POA: Insufficient documentation

## 2014-02-26 DIAGNOSIS — R102 Pelvic and perineal pain: Secondary | ICD-10-CM

## 2014-02-26 DIAGNOSIS — G8929 Other chronic pain: Secondary | ICD-10-CM | POA: Insufficient documentation

## 2014-02-26 DIAGNOSIS — Z794 Long term (current) use of insulin: Secondary | ICD-10-CM | POA: Insufficient documentation

## 2014-02-26 DIAGNOSIS — F17200 Nicotine dependence, unspecified, uncomplicated: Secondary | ICD-10-CM | POA: Insufficient documentation

## 2014-02-26 DIAGNOSIS — E119 Type 2 diabetes mellitus without complications: Secondary | ICD-10-CM | POA: Insufficient documentation

## 2014-02-26 LAB — URINALYSIS, ROUTINE W REFLEX MICROSCOPIC
Bilirubin Urine: NEGATIVE
Glucose, UA: >1000 mg/dL — AB
Hgb urine dipstick: NEGATIVE
Ketones, ur: NEGATIVE mg/dL
Leukocytes, UA: NEGATIVE
NITRITE: NEGATIVE
PROTEIN: NEGATIVE mg/dL
SPECIFIC GRAVITY, URINE: 1.022 (ref 1.005–1.030)
UROBILINOGEN UA: 0.2 mg/dL (ref 0.0–1.0)
pH: 5 (ref 5.0–8.0)

## 2014-02-26 LAB — COMPREHENSIVE METABOLIC PANEL
ALT: 15 U/L (ref 0–53)
AST: 14 U/L (ref 0–37)
Albumin: 3.3 g/dL — ABNORMAL LOW (ref 3.5–5.2)
Alkaline Phosphatase: 76 U/L (ref 39–117)
BUN: 10 mg/dL (ref 6–23)
CALCIUM: 9.2 mg/dL (ref 8.4–10.5)
CO2: 22 meq/L (ref 19–32)
CREATININE: 0.57 mg/dL (ref 0.50–1.35)
Chloride: 102 mEq/L (ref 96–112)
GLUCOSE: 177 mg/dL — AB (ref 70–99)
Potassium: 4.3 mEq/L (ref 3.7–5.3)
Sodium: 137 mEq/L (ref 137–147)
Total Protein: 6.9 g/dL (ref 6.0–8.3)

## 2014-02-26 LAB — CBC WITH DIFFERENTIAL/PLATELET
Basophils Absolute: 0 10*3/uL (ref 0.0–0.1)
Basophils Relative: 0 % (ref 0–1)
EOS PCT: 1 % (ref 0–5)
Eosinophils Absolute: 0.1 10*3/uL (ref 0.0–0.7)
HEMATOCRIT: 42.2 % (ref 39.0–52.0)
HEMOGLOBIN: 13.8 g/dL (ref 13.0–17.0)
LYMPHS ABS: 2.5 10*3/uL (ref 0.7–4.0)
LYMPHS PCT: 28 % (ref 12–46)
MCH: 28.6 pg (ref 26.0–34.0)
MCHC: 32.7 g/dL (ref 30.0–36.0)
MCV: 87.6 fL (ref 78.0–100.0)
MONO ABS: 0.6 10*3/uL (ref 0.1–1.0)
MONOS PCT: 7 % (ref 3–12)
Neutro Abs: 5.6 10*3/uL (ref 1.7–7.7)
Neutrophils Relative %: 63 % (ref 43–77)
Platelets: 292 10*3/uL (ref 150–400)
RBC: 4.82 MIL/uL (ref 4.22–5.81)
RDW: 13.8 % (ref 11.5–15.5)
WBC: 8.9 10*3/uL (ref 4.0–10.5)

## 2014-02-26 LAB — URINE MICROSCOPIC-ADD ON

## 2014-02-26 LAB — LIPASE, BLOOD: LIPASE: 16 U/L (ref 11–59)

## 2014-02-26 MED ORDER — OXYCODONE-ACETAMINOPHEN 5-325 MG PO TABS: 2.0000 | ORAL_TABLET | Freq: Four times a day (QID) | ORAL | Status: AC | PRN

## 2014-02-26 MED ORDER — ONDANSETRON HCL 4 MG/2ML IJ SOLN
4.0000 mg | Freq: Once | INTRAMUSCULAR | Status: AC
  Administered 2014-02-26: 4 mg via INTRAVENOUS
  Filled 2014-02-26: qty 2

## 2014-02-26 MED ORDER — MORPHINE SULFATE 4 MG/ML IJ SOLN
4.0000 mg | Freq: Once | INTRAMUSCULAR | Status: AC
  Administered 2014-02-26: 4 mg via INTRAVENOUS
  Filled 2014-02-26: qty 1

## 2014-02-26 NOTE — Discharge Instructions (Signed)
Bladder Cancer Bladder cancer is an abnormal growth of tissue in your bladder. Your bladder is the balloon-like sac in your pelvis. It collects and stores urine that comes from the kidneys through the ureters. The bladder wall is made of layers. If cancer spreads into these layers and through the wall of the bladder, it becomes more difficult to treat.  There are four stages of bladder cancer:  Stage I. Cancer at this stage occurs in the bladder's inner lining but has not invaded the muscular bladder wall.  Stage II. At this stage, cancer has invaded the bladder wall but is still confined to the bladder.  Stage III. By this stage, the cancer cells have spread through the bladder wall to surrounding tissue. They may also have spread to the prostate in men or the uterus or vagina in women.  Stage IV. By this stage, cancer cells may have spread to the lymph nodes and other organs, such as your lungs, bones, or liver. RISK FACTORS Although the cause of bladder cancer is not known, the following risk factors can increase your chances of getting bladder cancer:   Smoking.   Occupational exposures, such as rubber, leather, textile, dyes, chemicals, and paint.   Being white.   Age.   Being male.   Having chronic bladder inflammation.   Having a bladder cancer history.   Having a family history of bladder cancer (heredity).   Having had chemotherapy or radiation therapy to the pelvis.   Being exposed to arsenic.  SYMPTOMS   Blood in the urine.   Pain with urination.   Frequent bladder or urine infections.  Increase in urgency and frequency of urination. DIAGNOSIS  Your health care provider may suspect bladder cancer based on your description of urinary symptoms or based on the finding of blood or infection in the urine (especially if this has recurred several times). Other tests or procedures that may be performed include:   A narrow tube being inserted into your  bladder through your urethra (cystoscopy) in order to view the lining of your bladder for tumors.   A biopsy to sample the tumor to see if cancer is present.  If cancer is present, it will then be staged to determine its severity and extent. It is important to know how deeply into the bladder wall the cancer has grown and whether the cancer has spread to any other parts of your body. Staging may require blood tests or special scans such as a CT scan, MRI, bone scan, or chest X-ray.  TREATMENT  Once your cancer has been diagnosed and staged, you should discuss a treatment plan with your health care provider. Based on the stage of the cancer, one treatment or a combination of treatments may be recommended. The most common forms of treatment are:   Surgery. Procedures that may be done include transurethral resection and cystectomy.  Radiation therapy. This is infrequently used to treat bladder cancer.   Chemotherapy. During this treatment, drugs are used to kill cancer cells.  Immunotherapy. This is usually administered directly into the bladder. HOME CARE INSTRUCTIONS  Only take over-the-counter or prescription medicines for pain, discomfort, or fever as directed by your health care provider.   Maintain a healthy diet.   Consider joining a support group. This may help you learn to cope with the stress of having bladder cancer.   Seek advice to help you manage treatment side effects.   Keep all follow-up appointments as directed by your health care  provider.   Inform your cancer specialist if you are admitted to the hospital.  [place] IF:  There is blood in your urine.  You have symptoms of a urinary tract infection. These include:  Tiredness.  Shakiness.  Weakness.  Muscle aches.  Abdominal pain.  Frequent and intense urge to urinate (in young women).  Burning feeling in the bladder or urethra during urination (in young women). SEEK IMMEDIATE MEDICAL  CARE IF:  You are unable to urinate. Document Released: 12/05/2003 Document Revised: 08/04/2013 Document Reviewed: 05/25/2013 [facility] Patient Information 2014 [place].    Return to Oncology for follow-up on bladder lesion Take oxycodone as prescribed for mod-severe pain Take ibuprofen for milder pain  Return if symptoms worsen or if nausea, vomiting or hematuria

## 2014-02-26 NOTE — ED Provider Notes (Signed)
Medical screening examination/treatment/procedure(s) were performed by non-physician practitioner and as supervising physician I was immediately available for consultation/collaboration.    Kathalene Frames, MD 02/26/14 [phone]

## 2014-02-26 NOTE — ED Provider Notes (Signed)
CSN: [id number]     Arrival date & time 02/26/14  1144 History   First MD Initiated Contact with Patient 02/26/14 1206     Chief Complaint  Patient presents with  . Abdominal Pain     (Consider location/radiation/quality/duration/timing/severity/associated sxs/prior Treatment) Patient is a 43 y.o. male presenting with abdominal pain. The history is provided by the patient. No language interpreter was used.  Abdominal Pain Associated symptoms: no chest pain, no diarrhea, no dysuria, no hematuria, no nausea and no vomiting   pt is 43 year old male who presents with suprapubic pain and lower abdominal pain. He reports that he was seen here 4 days ago and had a CT done that showed a possible bladder tumor. He had bladder CA in 2008 and had a tumor excised at that time. He reports that he was referred to [facility] where he was seen before and he says he doesn't have the money for a visit at this time. He reports that he may be able to see them in a couple weeks but for now he has run out of pain meds and he reports that his pain is 10/10 today. He denies hematuria today although, he reports that he has had it in the last couple days. No dysuria, frequency or urgency. No pain or swelling in his testicles. Pain does radiate however to his groin.  Past Medical History  Diagnosis Date  . Diabetes mellitus   . Bladder cancer   . Hypertension   . Chronic back pain    Past Surgical History  Procedure Laterality Date  . Bladder surgery      bladder cancer surgery  . Thumb surgery     Family History  Problem Relation Age of Onset  . Coronary artery disease Father    History  Substance Use Topics  . Smoking status: Current Every Day Smoker -- 5 years    Types: Cigarettes  . Smokeless tobacco: Never Used  . Alcohol Use: Yes    Review of Systems  Cardiovascular: Negative for chest pain.  Gastrointestinal: Positive for abdominal pain. Negative for nausea, vomiting and diarrhea.    Genitourinary: Negative for dysuria, hematuria, scrotal swelling and difficulty urinating.  All other systems reviewed and are negative.      Allergies  Penicillins  Home Medications   Current Outpatient Rx  Name  Route  Sig  Dispense  Refill  . ibuprofen (ADVIL,MOTRIN) 200 MG tablet   Oral   Take 800 mg by mouth every 6 (six) hours as needed for pain.          Marland Kitchen insulin glargine (LANTUS) 100 UNIT/ML injection   Subcutaneous   Inject 30 Units into the skin every morning.         Marland Kitchen oxyCODONE-acetaminophen (PERCOCET/ROXICET) 5-325 MG per tablet   Oral   Take 1-2 tablets by mouth every 4 (four) hours as needed for severe pain.   20 tablet   0   . promethazine (PHENERGAN) 25 MG tablet   Oral   Take 1 tablet (25 mg total) by mouth every 6 (six) hours as needed for nausea or vomiting.   12 tablet   0   . oxyCODONE-acetaminophen (PERCOCET/ROXICET) 5-325 MG per tablet   Oral   Take 2 tablets by mouth every 6 (six) hours as needed for severe pain.   30 tablet   0    BP 109/64  Pulse 77  Temp(Src) 98.7 F (37.1 C) (Oral)  Resp 16  Ht  5\' 6"  (1.676 m)  Wt 245 lb (111.131 kg)  BMI 39.56 kg/m2  SpO2 97% Physical Exam  Nursing note and vitals reviewed. Constitutional: He is oriented to person, place, and time. He appears well-developed and well-nourished. No distress.  HENT:  Head: Normocephalic and atraumatic.  Eyes: Conjunctivae and EOM are normal.  Neck: Normal range of motion. Neck supple. No JVD present. No tracheal deviation present. No thyromegaly present.  Cardiovascular: Normal rate, regular rhythm, normal heart sounds and intact distal pulses.   Pulmonary/Chest: Effort normal and breath sounds normal. No respiratory distress. He has no wheezes.  Abdominal: Soft. Bowel sounds are normal. He exhibits no distension. There is tenderness in the suprapubic area. There is no rigidity and no guarding.    Pain and tenderness in suprapubic region and in groin  bilaterally  Musculoskeletal: Normal range of motion.  Lymphadenopathy:    He has no cervical adenopathy.  Neurological: He is alert and oriented to person, place, and time. Coordination normal.  Skin: Skin is warm and dry. No rash noted.  Psychiatric: He has a normal mood and affect. His behavior is normal. Judgment and thought content normal.    ED Course  Procedures (including critical care time) Labs Review Labs Reviewed  COMPREHENSIVE METABOLIC PANEL - Abnormal; Notable for the following:    Glucose, Bld 177 (*)    Albumin 3.3 (*)    Total Bilirubin <0.2 (*)    All other components within normal limits  URINALYSIS, ROUTINE W REFLEX MICROSCOPIC - Abnormal; Notable for the following:    Glucose, UA >1000 (*)    All other components within normal limits  CBC WITH DIFFERENTIAL  LIPASE, BLOOD  URINE MICROSCOPIC-ADD ON   Imaging Review No results found.   EKG Interpretation None      MDM   Final diagnoses:  Suprapubic pain    Reassuring abdominal exam. Afebrile, no leukocytosis. Patient had CT done 4 days ago that shows possible bladder pathology. Advised urology followup. Patient reports that he is working on getting an appointment with urology. He reports that he has hematuria on and off however, he is not having any tonight. He reports pain and bladder pressure. No signs of UTI. Patient feeling better after morphine and Zofran. Discussed plan of care with patient and strongly encouraged sooner follow up with urology. Prescription for oxycodone given. Return precautions given and patient agrees.      Elisha Headland, NP 03/01/14 0100

## 2014-02-26 NOTE — ED Notes (Signed)
Pt presents to department for evaluation of abdominal pain. States he was seen here last week, tumor found in abdomen. 10/10 pain upon arrival. Denies urinary symptoms. Pt is alert and oriented x4.

## 2014-03-03 NOTE — ED Provider Notes (Signed)
Medical screening examination/treatment/procedure(s) were performed by non-physician practitioner and as supervising physician I was immediately available for consultation/collaboration.   EKG Interpretation None        Orpah Greek, MD 03/03/14 [phone]

## 2014-03-11 ENCOUNTER — Encounter (HOSPITAL_COMMUNITY): Payer: Self-pay | Admitting: Emergency Medicine

## 2014-03-11 ENCOUNTER — Emergency Department (HOSPITAL_COMMUNITY)
Admission: EM | Admit: 2014-03-11 | Discharge: 2014-03-11 | Disposition: A | Discharge status: Home / Self Care | Payer: Self-pay | Attending: Emergency Medicine | Admitting: Emergency Medicine

## 2014-03-11 DIAGNOSIS — C679 Malignant neoplasm of bladder, unspecified: Secondary | ICD-10-CM | POA: Insufficient documentation

## 2014-03-11 DIAGNOSIS — G8929 Other chronic pain: Secondary | ICD-10-CM | POA: Insufficient documentation

## 2014-03-11 DIAGNOSIS — M549 Dorsalgia, unspecified: Secondary | ICD-10-CM | POA: Insufficient documentation

## 2014-03-11 DIAGNOSIS — Z880 Allergy status to penicillin: Secondary | ICD-10-CM | POA: Insufficient documentation

## 2014-03-11 DIAGNOSIS — R109 Unspecified abdominal pain: Secondary | ICD-10-CM | POA: Insufficient documentation

## 2014-03-11 DIAGNOSIS — E119 Type 2 diabetes mellitus without complications: Secondary | ICD-10-CM | POA: Insufficient documentation

## 2014-03-11 DIAGNOSIS — F17200 Nicotine dependence, unspecified, uncomplicated: Secondary | ICD-10-CM | POA: Insufficient documentation

## 2014-03-11 DIAGNOSIS — I10 Essential (primary) hypertension: Secondary | ICD-10-CM | POA: Insufficient documentation

## 2014-03-11 DIAGNOSIS — Z794 Long term (current) use of insulin: Secondary | ICD-10-CM | POA: Insufficient documentation

## 2014-03-11 LAB — CBC WITH DIFFERENTIAL/PLATELET
BASOS ABS: 0 10*3/uL (ref 0.0–0.1)
BASOS PCT: 1 % (ref 0–1)
EOS ABS: 0.1 10*3/uL (ref 0.0–0.7)
Eosinophils Relative: 1 % (ref 0–5)
HEMATOCRIT: 43.4 % (ref 39.0–52.0)
Hemoglobin: 14.7 g/dL (ref 13.0–17.0)
Lymphocytes Relative: 39 % (ref 12–46)
Lymphs Abs: 3.5 10*3/uL (ref 0.7–4.0)
MCH: 28.8 pg (ref 26.0–34.0)
MCHC: 33.9 g/dL (ref 30.0–36.0)
MCV: 84.9 fL (ref 78.0–100.0)
MONO ABS: 0.5 10*3/uL (ref 0.1–1.0)
MONOS PCT: 6 % (ref 3–12)
NEUTROS PCT: 53 % (ref 43–77)
Neutro Abs: 4.7 10*3/uL (ref 1.7–7.7)
Platelets: 329 10*3/uL (ref 150–400)
RBC: 5.11 MIL/uL (ref 4.22–5.81)
RDW: 13.2 % (ref 11.5–15.5)
WBC: 8.9 10*3/uL (ref 4.0–10.5)

## 2014-03-11 LAB — COMPREHENSIVE METABOLIC PANEL
ALT: 19 U/L (ref 0–53)
AST: 15 U/L (ref 0–37)
Albumin: 3.9 g/dL (ref 3.5–5.2)
Alkaline Phosphatase: 67 U/L (ref 39–117)
BILIRUBIN TOTAL: 0.4 mg/dL (ref 0.3–1.2)
BUN: 9 mg/dL (ref 6–23)
CHLORIDE: 101 meq/L (ref 96–112)
CO2: 24 mEq/L (ref 19–32)
CREATININE: 0.56 mg/dL (ref 0.50–1.35)
Calcium: 9.7 mg/dL (ref 8.4–10.5)
GFR calc Af Amer: >90 mL/min (ref >90)
GFR calc non Af Amer: >90 mL/min (ref >90)
Glucose, Bld: 148 mg/dL — ABNORMAL HIGH (ref 70–99)
Potassium: 4 mEq/L (ref 3.7–5.3)
Sodium: 139 mEq/L (ref 137–147)
TOTAL PROTEIN: 7.4 g/dL (ref 6.0–8.3)

## 2014-03-11 LAB — URINALYSIS, ROUTINE W REFLEX MICROSCOPIC
Bilirubin Urine: NEGATIVE
GLUCOSE, UA: NEGATIVE mg/dL
Hgb urine dipstick: NEGATIVE
KETONES UR: NEGATIVE mg/dL
Leukocytes, UA: NEGATIVE
NITRITE: NEGATIVE
Protein, ur: NEGATIVE mg/dL
SPECIFIC GRAVITY, URINE: 1.007 (ref 1.005–1.030)
Urobilinogen, UA: 0.2 mg/dL (ref 0.0–1.0)
pH: 6.5 (ref 5.0–8.0)

## 2014-03-11 LAB — LIPASE, BLOOD: Lipase: 21 U/L (ref 11–59)

## 2014-03-11 MED ORDER — OXYCODONE-ACETAMINOPHEN 5-325 MG PO TABS
2.0000 | ORAL_TABLET | Freq: Once | ORAL | Status: AC
  Administered 2014-03-11: 2 via ORAL
  Filled 2014-03-11: qty 2

## 2014-03-11 MED ORDER — OXYCODONE-ACETAMINOPHEN 7.5-325 MG PO TABS: 1.0000 | ORAL_TABLET | ORAL | Status: AC | PRN

## 2014-03-11 NOTE — Discharge Instructions (Signed)
Abdominal Pain, Adult °Many things can cause abdominal pain. Usually, abdominal pain is not caused by a disease and will improve without treatment. It can often be observed and treated at home. Your health care provider will do a physical exam and possibly order blood tests and X-rays to help determine the seriousness of your pain. However, in many cases, more time must pass before a clear cause of the pain can be found. Before that point, your health care provider may not know if you need more testing or further treatment. °HOME CARE INSTRUCTIONS  °Monitor your abdominal pain for any changes. The following actions may help to alleviate any discomfort you are experiencing: °· Only take over-the-counter or prescription medicines as directed by your health care provider. °· Do not take laxatives unless directed to do so by your health care provider. °· Try a clear liquid diet (broth, tea, or water) as directed by your health care provider. Slowly move to a bland diet as tolerated. °SEEK MEDICAL CARE IF: °· You have unexplained abdominal pain. °· You have abdominal pain associated with nausea or diarrhea. °· You have pain when you urinate or have a bowel movement. °· You experience abdominal pain that wakes you in the night. °· You have abdominal pain that is worsened or improved by eating food. °· You have abdominal pain that is worsened with eating fatty foods. °SEEK IMMEDIATE MEDICAL CARE IF:  °· Your pain does not go away within 2 hours. °· You have a fever. °· You keep throwing up (vomiting). °· Your pain is felt only in portions of the abdomen, such as the right side or the left lower portion of the abdomen. °· You pass bloody or black tarry stools. °MAKE SURE YOU: °· Understand these instructions.   °· Will watch your condition.   °· Will get help right away if you are not doing well or get worse.   °Document Released: 09/11/2005 Document Revised: 09/22/2013 Document Reviewed: 08/11/2013 °[employer]® Patient  Information ©2014 [employer]. ° °

## 2014-03-11 NOTE — ED Notes (Addendum)
Pt reports lower abdominal pain and hematuria  x "months"; recently seen here and dx with "tumor". Pt has f/u appt with nephrology on 4/ 6 but states "the pain is just unbearable." Denies taking anything today for pain.  Denies F/C, N/V;NAD.

## 2014-03-11 NOTE — ED Provider Notes (Signed)
CSN: [id number]     Arrival date & time 03/11/14  1612 History   First MD Initiated Contact with Patient 03/11/14 1830     Chief Complaint  Patient presents with  . Abdominal Pain     (Consider location/radiation/quality/duration/timing/severity/associated sxs/prior Treatment) Patient is a 43 y.o. male presenting with abdominal pain. The history is provided by the patient.  Abdominal Pain Pain location:  Suprapubic Pain quality: aching and dull   Pain radiates to:  Does not radiate Pain severity:  Moderate Onset quality:  Gradual Duration: pt has had pain for months, had CT scan done here a month ago showing bladder ca. Timing:  Constant Progression:  Unchanged Chronicity:  Recurrent Context comment:  Pt had workup of pain 1 month ago revealing tumor in bladder, has urology f/u on 04/06 states he ran out of pain meds, noc hange in pain, denies n/v/d, f/c, chest pain, SOB, still passing small amount of blood, unchanged, no dysuria Relieved by: oxycodone. Worsened by:  Nothing tried Ineffective treatments:  None tried Associated symptoms: hematuria   Associated symptoms: no anorexia, no chest pain, no constipation, no cough, no diarrhea, no fever, no hematemesis, no nausea, no shortness of breath and no vomiting   Risk factors: obesity   Risk factors: has not had multiple surgeries     Past Medical History  Diagnosis Date  . Diabetes mellitus   . Bladder cancer   . Hypertension   . Chronic back pain    Past Surgical History  Procedure Laterality Date  . Bladder surgery      bladder cancer surgery  . Thumb surgery     Family History  Problem Relation Age of Onset  . Coronary artery disease Father    History  Substance Use Topics  . Smoking status: Current Every Day Smoker -- 5 years    Types: Cigarettes  . Smokeless tobacco: Never Used  . Alcohol Use: Yes    Review of Systems  Constitutional: Negative for fever, activity change and appetite change.  HENT:  Negative for congestion and rhinorrhea.   Eyes: Negative for discharge and itching.  Respiratory: Negative for cough, shortness of breath and wheezing.   Cardiovascular: Negative for chest pain.  Gastrointestinal: Positive for abdominal pain. Negative for nausea, vomiting, diarrhea, constipation, anorexia and hematemesis.  Genitourinary: Positive for hematuria. Negative for decreased urine volume and difficulty urinating.  Skin: Negative for rash and wound.  Neurological: Negative for syncope, weakness and numbness.  All other systems reviewed and are negative.      Allergies  Penicillins  Home Medications   Current Outpatient Rx  Name  Route  Sig  Dispense  Refill  . ibuprofen (ADVIL,MOTRIN) 200 MG tablet   Oral   Take 800 mg by mouth every 6 (six) hours as needed for pain.          Marland Kitchen insulin glargine (LANTUS) 100 UNIT/ML injection   Subcutaneous   Inject 30 Units into the skin every morning.         Marland Kitchen oxyCODONE-acetaminophen (PERCOCET/ROXICET) 5-325 MG per tablet   Oral   Take 2 tablets by mouth every 6 (six) hours as needed for severe pain.   30 tablet   0   . promethazine (PHENERGAN) 25 MG tablet   Oral   Take 1 tablet (25 mg total) by mouth every 6 (six) hours as needed for nausea or vomiting.   12 tablet   0   . oxyCODONE-acetaminophen (PERCOCET) 7.5-325 MG per tablet  Oral   Take 1 tablet by mouth every 4 (four) hours as needed for pain.   30 tablet   0    BP 121/85  Pulse 71  Temp(Src) 98 F (36.7 C) (Oral)  Resp 20  SpO2 99% Physical Exam  Vitals reviewed. Constitutional: He is oriented to person, place, and time. He appears well-developed and well-nourished. No distress.  HENT:  Head: Normocephalic and atraumatic.  Mouth/Throat: Oropharynx is clear and moist. No oropharyngeal exudate.  Eyes: Conjunctivae and EOM are normal. Pupils are equal, round, and reactive to light. Right eye exhibits no discharge. Left eye exhibits no discharge. No  scleral icterus.  Neck: Normal range of motion. Neck supple.  Cardiovascular: Normal rate, regular rhythm, normal heart sounds and intact distal pulses.  Exam reveals no gallop and no friction rub.   No murmur heard. Pulmonary/Chest: Effort normal and breath sounds normal. No respiratory distress. He has no wheezes. He has no rales.  Abdominal: Soft. He exhibits no distension and no mass. There is tenderness (suprapubic ttp, no upper abd ttp, no rigidity, neg Mcburneys, no peritonitis).  Musculoskeletal: Normal range of motion.  Neurological: He is alert and oriented to person, place, and time. No cranial nerve deficit. He exhibits normal muscle tone. Coordination normal.  Skin: Skin is warm. No rash noted. He is not diaphoretic.    ED Course  Procedures (including critical care time) Labs Review Labs Reviewed  COMPREHENSIVE METABOLIC PANEL - Abnormal; Notable for the following:    Glucose, Bld 148 (*)    All other components within normal limits  CBC WITH DIFFERENTIAL  LIPASE, BLOOD  URINALYSIS, ROUTINE W REFLEX MICROSCOPIC   Imaging Review No results found.   EKG Interpretation None      MDM   MDM: 43 y.o. WM w/ PMHx of DM w/ cc:: of abd pain. Pt in ED a month ago for workup of simialr abd pain found mass on bladder. Has f/u on 04/06. Was given pain meds but ran out. States pain is similar, no change in location, severity or quality. Has mild heamturia which is unchanged, no dysuria, no n/v/d, no f/c. Well appearing, AFVSS. Abd tender suprapubically, no rigidity, no concerning features. Labs and urine WNL. Will give dose of pain pills here and rx. Instructed to keep appointment on 04/06 and return to ED if pain changes or new sxs develop. Discharged. Care of case d/w my attenidng.   Final diagnoses:  Abdominal pain    Discharged  Sol Passer, MD 03/11/14 2106

## 2014-03-16 NOTE — ED Provider Notes (Signed)
I saw and evaluated the patient, reviewed the resident's note and I agree with the findings and plan.   EKG Interpretation None     Pain secondary to bladder CA. Has specialty follow up scheduled, has not happened yet. Patient has had very recent workup and pain is identical to previous pain. Does not require any significant workup, analgesia provided.   Orpah Greek, MD 03/16/14 [phone]

## 2014-06-07 ENCOUNTER — Encounter (HOSPITAL_COMMUNITY): Payer: Self-pay | Admitting: Emergency Medicine

## 2014-06-07 ENCOUNTER — Emergency Department (HOSPITAL_COMMUNITY)
Admission: EM | Admit: 2014-06-07 | Discharge: 2014-06-07 | Disposition: A | Discharge status: Home / Self Care | Payer: Self-pay | Attending: Emergency Medicine | Admitting: Emergency Medicine

## 2014-06-07 DIAGNOSIS — F17200 Nicotine dependence, unspecified, uncomplicated: Secondary | ICD-10-CM | POA: Insufficient documentation

## 2014-06-07 DIAGNOSIS — M25529 Pain in unspecified elbow: Secondary | ICD-10-CM | POA: Insufficient documentation

## 2014-06-07 DIAGNOSIS — Z8551 Personal history of malignant neoplasm of bladder: Secondary | ICD-10-CM | POA: Insufficient documentation

## 2014-06-07 DIAGNOSIS — S40022A Contusion of left upper arm, initial encounter: Secondary | ICD-10-CM

## 2014-06-07 DIAGNOSIS — E119 Type 2 diabetes mellitus without complications: Secondary | ICD-10-CM | POA: Insufficient documentation

## 2014-06-07 DIAGNOSIS — Z794 Long term (current) use of insulin: Secondary | ICD-10-CM | POA: Insufficient documentation

## 2014-06-07 DIAGNOSIS — Z880 Allergy status to penicillin: Secondary | ICD-10-CM | POA: Insufficient documentation

## 2014-06-07 DIAGNOSIS — Z8679 Personal history of other diseases of the circulatory system: Secondary | ICD-10-CM | POA: Insufficient documentation

## 2014-06-07 MED ORDER — TRAMADOL HCL 50 MG PO TABS: 50.0000 mg | ORAL_TABLET | Freq: Four times a day (QID) | ORAL | Status: AC | PRN

## 2014-06-07 MED ORDER — HYDROCODONE-ACETAMINOPHEN 5-325 MG PO TABS
2.0000 | ORAL_TABLET | Freq: Once | ORAL | Status: AC
  Administered 2014-06-07: 2 via ORAL
  Filled 2014-06-07: qty 2

## 2014-06-07 NOTE — Discharge Instructions (Signed)
Take tramadol as needed for pain. Apply ice as needed for pain and swelling. Refer to attached documents for more information.

## 2014-06-07 NOTE — ED Provider Notes (Signed)
CSN: [id number]     Arrival date & time 06/07/14  1053 History  This chart was scribed for non-physician practitioner, Alvina Chou, PA-C, working with Threasa Beards, MD, by Delphia Grates, ED Scribe. This patient was seen in room TR10C/TR10C and the patient's care was started at 11:40 AM.    Chief Complaint  Patient presents with  . Arm Pain     Patient is a 43 y.o. male presenting with arm pain. The history is provided by the patient. No language interpreter was used.  Arm Pain This is a new problem. The current episode started yesterday. The problem has been gradually improving. Pertinent negatives include no chest pain, no abdominal pain, no headaches and no shortness of breath. The symptoms are aggravated by bending and twisting. The symptoms are relieved by ice. He has tried a cold compress for the symptoms. The treatment provided mild relief.    HPI Comments: Lawrence Pearson is a 43 y.o. male who presents to the Emergency Department complaining of constant, moderate, upper, left arm pain the began yesterday. Patient states he was giving plasma and reached into his back pocket and "bent the needle in his arm". There is associated swelling and bruising at the site. Patient has taken ibuprofen and applied ice with some improvement. He denies numbness or tingling. Patient has history of DM and HTN.  Past Medical History  Diagnosis Date  . Diabetes mellitus   . Bladder cancer   . Hypertension   . Chronic back pain    Past Surgical History  Procedure Laterality Date  . Bladder surgery      bladder cancer surgery  . Thumb surgery     Family History  Problem Relation Age of Onset  . Coronary artery disease Father    History  Substance Use Topics  . Smoking status: Current Every Day Smoker -- 5 years    Types: Cigarettes  . Smokeless tobacco: Never Used  . Alcohol Use: Yes    Review of Systems  Respiratory: Negative for shortness of breath.   Cardiovascular:  Negative for chest pain.  Gastrointestinal: Negative for abdominal pain.  Musculoskeletal:       Left arm pain  Skin: Positive for color change and wound.  Neurological: Negative for headaches.  All other systems reviewed and are negative.     Allergies  Penicillins  Home Medications   Prior to Admission medications   Medication Sig Start Date End Date Taking? Authorizing Provider  ibuprofen (ADVIL,MOTRIN) 200 MG tablet Take 800 mg by mouth every 6 (six) hours as needed for pain.     Historical Provider, MD  insulin glargine (LANTUS) 100 UNIT/ML injection Inject 30 Units into the skin every morning.    Historical Provider, MD  oxyCODONE-acetaminophen (PERCOCET) 7.5-325 MG per tablet Take 1 tablet by mouth every 4 (four) hours as needed for pain. 03/11/14   Sol Passer, MD  oxyCODONE-acetaminophen (PERCOCET/ROXICET) 5-325 MG per tablet Take 2 tablets by mouth every 6 (six) hours as needed for severe pain. 02/26/14   Elisha Headland, NP  promethazine (PHENERGAN) 25 MG tablet Take 1 tablet (25 mg total) by mouth every 6 (six) hours as needed for nausea or vomiting. 02/22/14   Elwyn Lade, PA-C   Triage Vitals: BP 121/84  Pulse 102  Temp(Src) 97.5 F (36.4 C) (Oral)  Resp 16  Ht 5\' 6"  (1.676 m)  Wt 230 lb (104.327 kg)  BMI 37.14 kg/m2  SpO2 97%  Physical Exam  Nursing note  and vitals reviewed. Constitutional: He is oriented to person, place, and time. He appears well-developed and well-nourished. No distress.  HENT:  Head: Normocephalic and atraumatic.  Eyes: Conjunctivae and EOM are normal.  Neck: Neck supple. No tracheal deviation present.  Cardiovascular: Normal rate.   Pulmonary/Chest: Effort normal. No respiratory distress.  Musculoskeletal: Normal range of motion.  Left arm tenderness with associated hematoma over left bicep area. No obvious joint deformity.   Neurological: He is alert and oriented to person, place, and time.  Skin: Skin is warm and dry.  Psychiatric:  He has a normal mood and affect. His behavior is normal.    ED Course  Procedures (including critical care time)  DIAGNOSTIC STUDIES: Oxygen Saturation is 97% on room air, adequate by my interpretation.    COORDINATION OF CARE: At 66 Discussed treatment plan with patient which includes Ultram. Patient agrees.   Labs Review Labs Reviewed - No data to display  Imaging Review No results found.   EKG Interpretation None      MDM   Final diagnoses:  Hematoma of arm, left, initial encounter    Patient has hematoma of left arm likely due to infiltrated IV from plasma donation. I explained to the patient this is self limiting. Patient instructed to apply ice and elevate the arm. Patient will have tramadol for pain.   I personally performed the services described in this documentation, which was scribed in my presence. The recorded information has been reviewed and is accurate.    Alvina Chou, [place] 06/07/14 1533

## 2014-06-07 NOTE — Discharge Planning (Signed)
[place] to patient about primary care resources and establishing care with a provider. Patient was given the orange card application and resource guide. Patient sts he is familiar with the orange card and has had it in the past. My contact information was also provided for any future questions or concerns. No other needs expressed at this time.

## 2014-06-07 NOTE — ED Notes (Signed)
Pt. Stated, i gave plasma yesterday and afterwards my upper left arm started swelling and painful.

## 2014-06-07 NOTE — ED Provider Notes (Signed)
Medical screening examination/treatment/procedure(s) were performed by non-physician practitioner and as supervising physician I was immediately available for consultation/collaboration.   EKG Interpretation None       Threasa Beards, MD 06/07/14 [phone]

## 2014-08-16 DEATH — deceased

## 2014-11-24 ENCOUNTER — Encounter (HOSPITAL_COMMUNITY): Payer: Self-pay | Admitting: Cardiology

## 2016-02-02 IMAGING — CT CT ABD-PELV W/O CM
2 of 4 series · 16 of 46 positions shown, 18 images · non-contrast
Comparison: none

[Series 5: stone study 5.0 i30f 1 · axial · 0.84mm/px · z∈[-680,-210]mm · 13 of 104 slices shown, 15 images]
[im 5/104  soft-tissue]
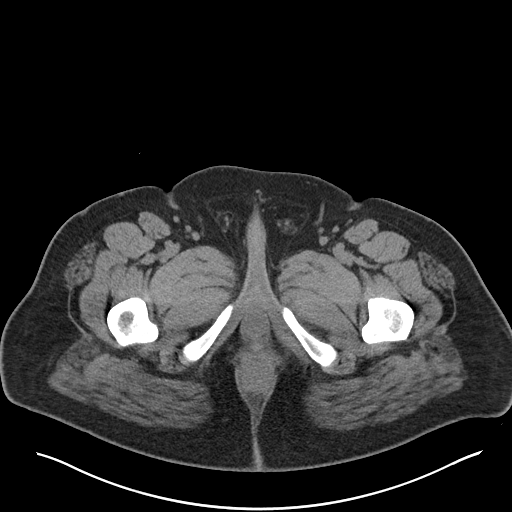
[im 5/104  bone]
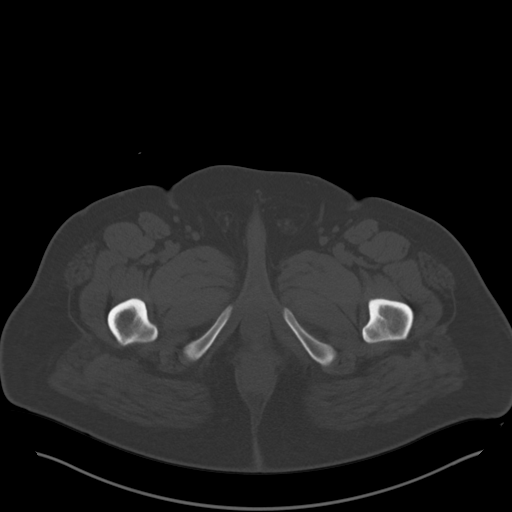
[im 13/104  soft-tissue]
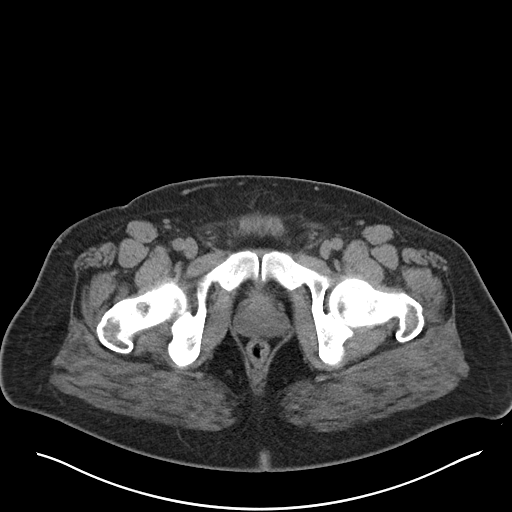
[im 22/104  soft-tissue]
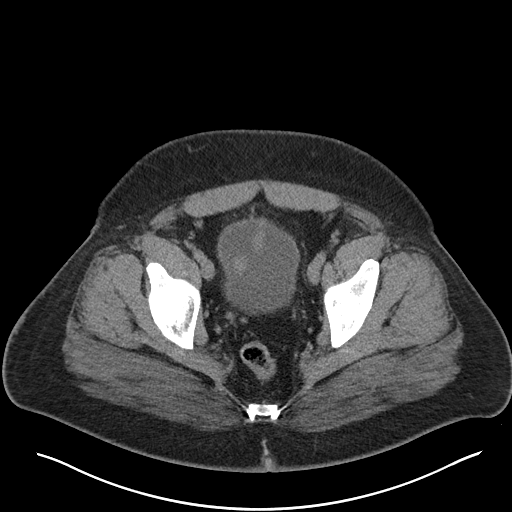
[im 31/104  soft-tissue]
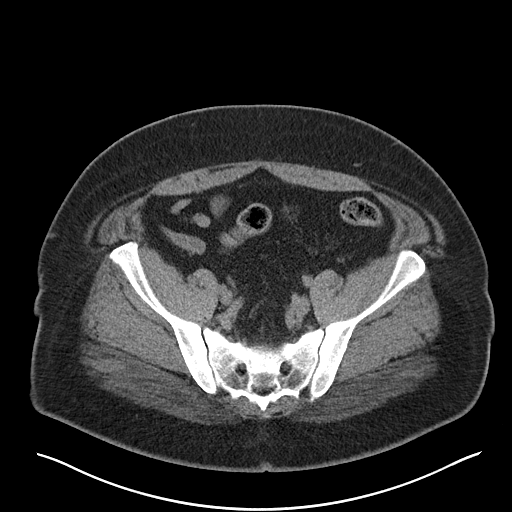
[im 35/104  soft-tissue]
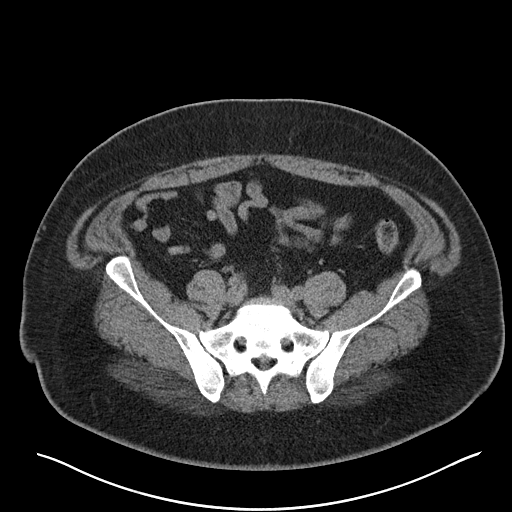
[im 43/104  soft-tissue]
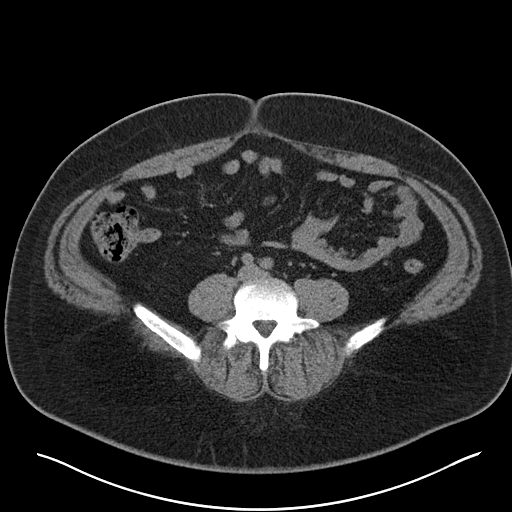
[im 52/104  soft-tissue]
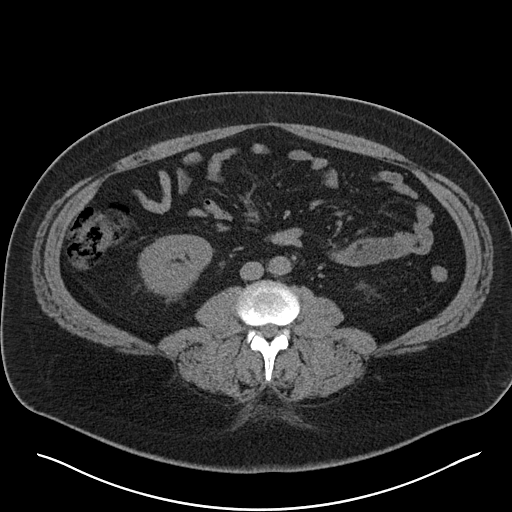
[im 61/104  soft-tissue]
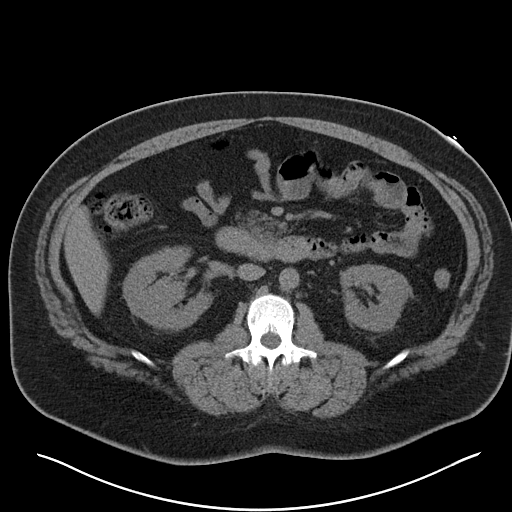
[im 69/104  soft-tissue]
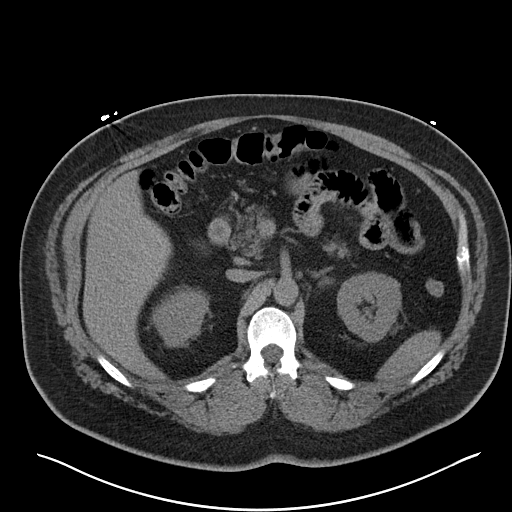
[im 69/104  bone]
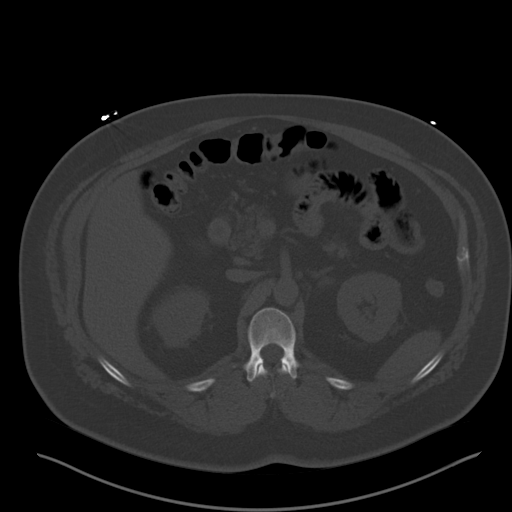
[im 73/104  soft-tissue]
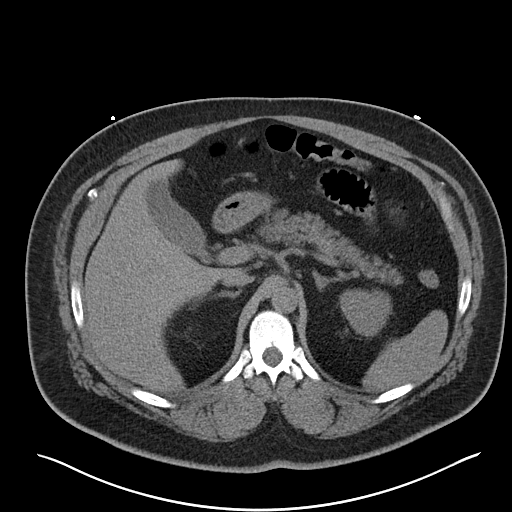
[im 82/104  soft-tissue]
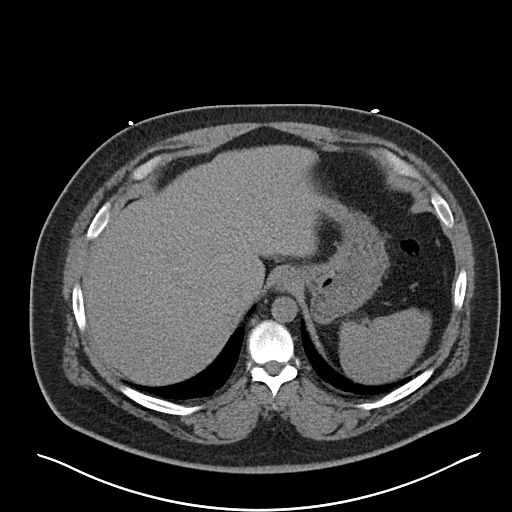
[im 91/104  soft-tissue]
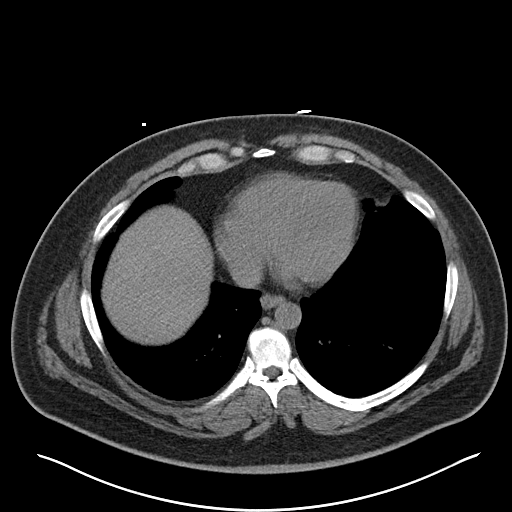
[im 99/104  soft-tissue]
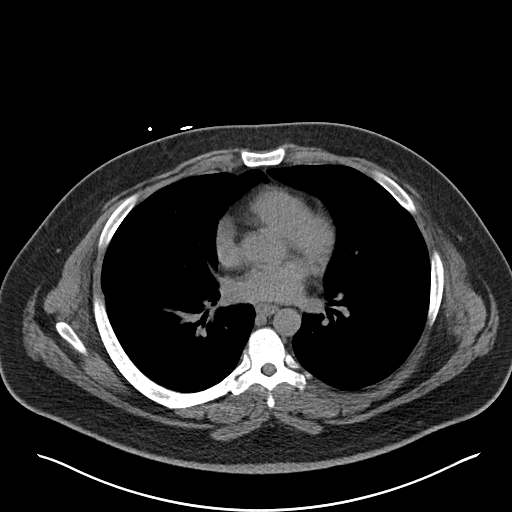

[Series 8: coronal soft tissue · coronal · 0.92mm/px · 3 of 82 slices shown]
[im 28/82  soft-tissue]
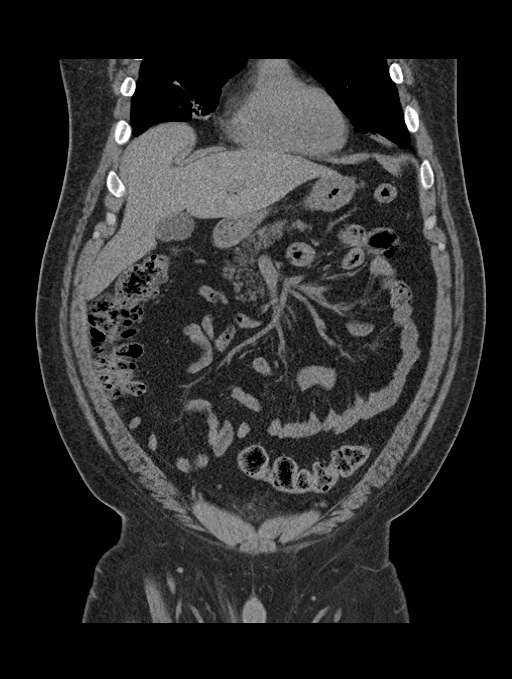
[im 37/82  soft-tissue]
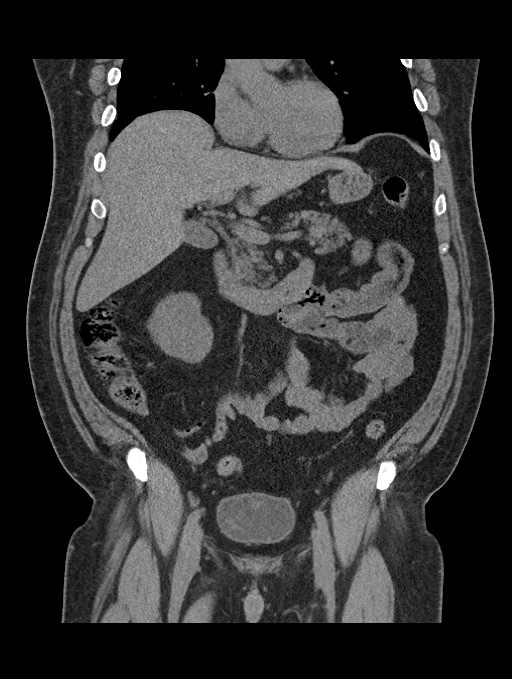
[im 46/82  soft-tissue]
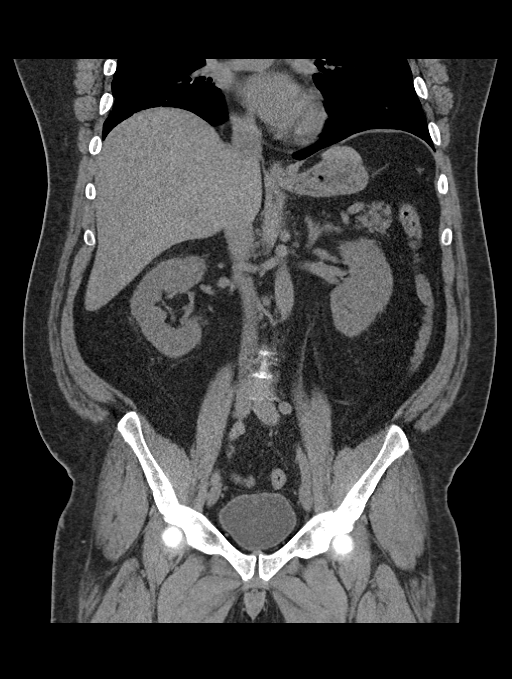

[16 of 46 positions shown; findings below may reference images not displayed]

CLINICAL DATA
Abdomen and groin pain

EXAM
CT ABDOMEN AND PELVIS WITHOUT CONTRAST

TECHNIQUE
Multidetector CT imaging of the abdomen and pelvis was performed
following the standard protocol without intravenous contrast.

COMPARISON
September 30, 2008

FINDINGS
The liver, spleen, pancreas, gallbladder, adrenal glands and kidneys
are normal. There is mild chronic bilateral perinephric stranding
unchanged. There is no nephrolithiasis or hydroureteronephrosis
bilaterally. The aorta is normal. There is no abdominal
lymphadenopathy. There is no small bowel obstruction or
diverticulitis. The appendix is normal. Minimal umbilical herniation
of mesenteric fat is identified.

Within the bladder, there are increased density. One of the
increased density areas is inseparable from the anterior right
bladder wall measuring 1.5 x 2.1 cm. There is no pelvic
lymphadenopathy. There is mild atelectasis of the anterior right
lung base. No acute abnormalities identified within the visualized
bones.

IMPRESSION
No acute abnormality identified in the abdomen.

Several areas of increased density within the bladder most are
probably due to blood clots. However, there is a 1.5 x 2.1 cm
increased density inseparable from anterior right lateral wall,
bladder wall mass is not entirely excluded.

SIGNATURE
